# Patient Record
Sex: Male | Born: 1960 | Race: Black or African American | Hispanic: No | Marital: Single | Smoking: Light tobacco smoker
Health system: Southern US, Community
[De-identification: ages and names within clinical notes are randomized; demographics above are authoritative.]

## PROBLEM LIST (undated history)

## (undated) HISTORY — PX: KNEE SURGERY: SHX244

---

## 2004-10-03 ENCOUNTER — Emergency Department: Payer: Self-pay | Admitting: Emergency Medicine

## 2014-02-27 ENCOUNTER — Emergency Department: Payer: Self-pay | Admitting: Emergency Medicine

## 2015-02-20 ENCOUNTER — Emergency Department
Admission: EM | Admit: 2015-02-20 | Discharge: 2015-02-20 | Disposition: A | Discharge status: Home / Self Care | Payer: 59 | Attending: Emergency Medicine | Admitting: Emergency Medicine

## 2015-02-20 ENCOUNTER — Emergency Department: Payer: 59

## 2015-02-20 ENCOUNTER — Encounter: Payer: Self-pay | Admitting: Emergency Medicine

## 2015-02-20 DIAGNOSIS — M167 Other unilateral secondary osteoarthritis of hip: Secondary | ICD-10-CM | POA: Diagnosis not present

## 2015-02-20 DIAGNOSIS — F17200 Nicotine dependence, unspecified, uncomplicated: Secondary | ICD-10-CM | POA: Insufficient documentation

## 2015-02-20 DIAGNOSIS — M25552 Pain in left hip: Secondary | ICD-10-CM | POA: Diagnosis present

## 2015-02-20 MED ORDER — NAPROXEN 500 MG PO TBEC: 500.0000 mg | DELAYED_RELEASE_TABLET | Freq: Two times a day (BID) | ORAL | Status: DC

## 2015-02-20 MED ORDER — PREDNISONE 10 MG PO TABS: ORAL_TABLET | ORAL | Status: DC

## 2015-02-20 NOTE — Discharge Instructions (Signed)

## 2015-02-20 NOTE — ED Notes (Signed)
Developed left hip pain w/o injury about 1-2 week ago  States pain  Is only in hip  Non radiating

## 2015-02-20 NOTE — ED Provider Notes (Signed)
[facility] Emergency Department Provider Note  ____________________________________________  Time seen: Approximately 10:41 AM  I have reviewed the triage vital signs and the nursing notes.   HISTORY  Chief Complaint Hip Pain   HPI Dean Mueller is a 54 y.o. male who presents for evaluation of nonspecific left hip pain 1-2 weeks ago. Patient states the hematoma he realizes that he walked work 1 day. States that initially getting up or initially walking and pain in his left hip is severe has a hard time putting weight on it but as he gets: The pain seems a little bit better.   History reviewed. No pertinent past medical history.  There are no active problems to display for this patient.   History reviewed. No pertinent past surgical history.  Current Outpatient Rx  Name  Route  Sig  Dispense  Refill  . naproxen (EC NAPROSYN) 500 MG EC tablet   Oral   Take 1 tablet (500 mg total) by mouth 2 (two) times daily with a meal.   60 tablet   0   . predniSONE (DELTASONE) 10 MG tablet      Take 5 pills daily x 5 days   25 tablet   0     Allergies Review of patient's allergies indicates no known allergies.  No family history on file.  Social History Social History  Substance Use Topics  . Smoking status: Light Tobacco Smoker  . Smokeless tobacco: None  . Alcohol Use: Yes    Review of Systems Constitutional: No fever/chills Eyes: No visual changes. ENT: No sore throat. Cardiovascular: Denies chest pain. Respiratory: Denies shortness of breath. Gastrointestinal: No abdominal pain.  No nausea, no vomiting.  No diarrhea.  No constipation. Genitourinary: Negative for dysuria. Musculoskeletal: Positive for left hip pain. Skin: Negative for rash. Neurological: Negative for headaches, focal weakness or numbness.  10-point ROS otherwise negative.  ____________________________________________   PHYSICAL EXAM:  VITAL SIGNS: ED Triage  Vitals  Enc Vitals Group     BP 02/20/15 1027 115/90 mmHg     Pulse Rate 02/20/15 1027 86     Resp 02/20/15 1027 20     Temp 02/20/15 1027 98.1 F (36.7 C)     Temp src --      SpO2 02/20/15 1027 96 %     Weight 02/20/15 1027 250 lb (113.399 kg)     Height 02/20/15 1027 6\' 2"  (1.88 m)     Head Cir --      Peak Flow --      Pain Score 02/20/15 1028 9     Pain Loc --      Pain Edu? --      Excl. in GC? --     Constitutional: Alert and oriented. Well appearing and in no acute distress. Patient was getting up and down out of the wheelchair to turn the TV on and change channels. Ambulates the couch but methodically Cardiovascular: Normal rate, regular rhythm. Grossly normal heart sounds.  Good peripheral circulation. Respiratory: Normal respiratory effort.  No retractions. Lungs CTAB. Gastrointestinal: Soft and nontender. No distention. No abdominal bruits. No CVA tenderness. Musculoskeletal: No lower extremity tenderness nor edema.  No joint effusions. Neurologic:  Normal speech and language. No gross focal neurologic deficits are appreciated.  Skin:  Skin is warm, dry and intact. No rash noted. Psychiatric: Mood and affect are normal. Speech and behavior are normal.  ____________________________________________   LABS (all labs ordered are listed, but only abnormal results  are displayed)  Labs Reviewed - No data to display ____________________________________________   RADIOLOGY  Some minimal osteophytes noted. Otherwise degenerative joint disease of the left hip. ____________________________________________   PROCEDURES  Procedure(s) performed: None  Critical Care performed: No  ____________________________________________   INITIAL IMPRESSION / ASSESSMENT AND PLAN / ED COURSE  Pertinent labs & imaging results that were available during my care of the patient were reviewed by me and considered in my medical decision making (see chart for details).  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 Rx given for prednisone 50 mg daily 5 days, and start on naproxen 500 mg twice a day. Follow-up PCP or return to the ER with any worsening symptomology. Patient voices no other emergency medical complaints at this time. ____________________________________________   FINAL CLINICAL IMPRESSION(S) / ED DIAGNOSES  Final diagnoses:  Other secondary osteoarthritis of hip      Evangeline Dakin, PA-C 02/20/15 1126  Governor Rooks, MD 02/20/15 [phone]

## 2015-09-29 ENCOUNTER — Encounter: Payer: Self-pay | Admitting: *Deleted

## 2015-09-29 ENCOUNTER — Emergency Department
Admission: EM | Admit: 2015-09-29 | Discharge: 2015-09-29 | Disposition: A | Discharge status: Home / Self Care | Payer: Self-pay | Attending: Emergency Medicine | Admitting: Emergency Medicine

## 2015-09-29 DIAGNOSIS — F17210 Nicotine dependence, cigarettes, uncomplicated: Secondary | ICD-10-CM | POA: Insufficient documentation

## 2015-09-29 DIAGNOSIS — M779 Enthesopathy, unspecified: Secondary | ICD-10-CM | POA: Insufficient documentation

## 2015-09-29 DIAGNOSIS — M76891 Other specified enthesopathies of right lower limb, excluding foot: Secondary | ICD-10-CM

## 2015-09-29 MED ORDER — PREDNISONE 10 MG (21) PO TBPK: ORAL_TABLET | ORAL | Status: DC

## 2015-09-29 MED ORDER — NAPROXEN 500 MG PO TABS: 500.0000 mg | ORAL_TABLET | Freq: Two times a day (BID) | ORAL | Status: DC

## 2015-09-29 NOTE — ED Notes (Signed)
Pt reports right hip pain with no injury

## 2015-09-29 NOTE — ED Provider Notes (Signed)
[facility] Emergency Department Provider Note ____________________________________________  Time seen: Approximately 1:04 PM  I have reviewed the triage vital signs and the nursing notes.   HISTORY  Chief Complaint Hip Pain    HPI Dean Mueller is a 55 y.o. male who presents to the emergency department for evaluation of right hip pain. He denies recent injury.he states his symptoms come and go and are worse after doing lawn care. This time, pain was present after mowing 3 lawns. He states he has been evaluated here for the same and given some medications that helped. [facility] and [facility] gave him medicine that didn't help. He denies injury. He has not taken any medications recently that have helped.   History reviewed. No pertinent past medical history.  There are no active problems to display for this patient.   No past surgical history on file.  Current Outpatient Rx  Name  Route  Sig  Dispense  Refill  . naproxen (NAPROSYN) 500 MG tablet   Oral   Take 1 tablet (500 mg total) by mouth 2 (two) times daily with a meal.   30 tablet   0   . predniSONE (STERAPRED UNI-PAK 21 TAB) 10 MG (21) TBPK tablet      Take 6 tablets on day 1 Take 5 tablets on day 2 Take 4 tablets on day 3 Take 3 tablets on day 4 Take 2 tablets on day 5 Take 1 tablet on day 6   21 tablet   0     Allergies Review of patient's allergies indicates no known allergies.  No family history on file.  Social History Social History  Substance Use Topics  . Smoking status: Light Tobacco Smoker    Types: Cigarettes  . Smokeless tobacco: None  . Alcohol Use: Yes    Review of Systems Constitutional: No recent illness. Cardiovascular: Denies chest pain or palpitations. Respiratory: Denies shortness of breath. Musculoskeletal: Pain in right hip with radiation into the right inner thigh. Skin: Negative for rash, wound, lesion. Neurological: Negative for focal weakness or  numbness.  ____________________________________________   PHYSICAL EXAM:  VITAL SIGNS: ED Triage Vitals  Enc Vitals Group     BP 09/29/15 1148 132/78 mmHg     Pulse Rate 09/29/15 1148 81     Resp 09/29/15 1148 18     Temp 09/29/15 1148 97.9 F (36.6 C)     Temp Source 09/29/15 1148 Oral     SpO2 09/29/15 1148 95 %     Weight 09/29/15 1148 247 lb (112.038 kg)     Height 09/29/15 1148 6\' 2"  (1.88 m)     Head Cir --      Peak Flow --      Pain Score 09/29/15 1145 9     Pain Loc --      Pain Edu? --      Excl. in GC? --     Constitutional: Alert and oriented. Well appearing and in no acute distress. Eyes: Conjunctivae are normal. EOMI. Head: Atraumatic. Neck: No stridor.  Respiratory: Normal respiratory effort.   Musculoskeletal: Pain in the right hip with flexion. Ambulation is observed with steady gait. Neurologic:  Normal speech and language. No gross focal neurologic deficits are appreciated. Speech is normal. No gait instability. Skin:  Skin is warm, dry and intact. Atraumatic. Psychiatric: Mood and affect are normal. Speech and behavior are normal.  ____________________________________________   LABS (all labs ordered are listed, but only abnormal  results are displayed)  Labs Reviewed - No data to display ____________________________________________  RADIOLOGY  Not indicated. ____________________________________________   PROCEDURES  Procedure(s) performed: None   ____________________________________________   INITIAL IMPRESSION / ASSESSMENT AND PLAN / ED COURSE  Pertinent labs & imaging results that were available during my care of the patient were reviewed by me and considered in my medical decision making (see chart for details).  Patient to be given prednisone and naproxen as prescribed at his last visit here that he reports helped his symptoms tremendously. He was instructed to follow up with orthopedics for symptoms that are not improving with  medications or if the symptoms return. He is to return to the ER for symptoms that change or worsen if unable to schedule an appointment. ____________________________________________   FINAL CLINICAL IMPRESSION(S) / ED DIAGNOSES  Final diagnoses:  Hip flexor tendonitis, right       Chinita PesterCari B Amarien Carne, FNP 09/30/15 [phone]  Sharman CheekPhillip Stafford, MD 10/02/15 1435

## 2016-04-22 ENCOUNTER — Emergency Department
Admission: EM | Admit: 2016-04-22 | Discharge: 2016-04-22 | Disposition: A | Discharge status: Home / Self Care | Payer: Self-pay | Attending: Emergency Medicine | Admitting: Emergency Medicine

## 2016-04-22 ENCOUNTER — Encounter: Payer: Self-pay | Admitting: Emergency Medicine

## 2016-04-22 DIAGNOSIS — B9789 Other viral agents as the cause of diseases classified elsewhere: Secondary | ICD-10-CM

## 2016-04-22 DIAGNOSIS — F17210 Nicotine dependence, cigarettes, uncomplicated: Secondary | ICD-10-CM | POA: Insufficient documentation

## 2016-04-22 DIAGNOSIS — J069 Acute upper respiratory infection, unspecified: Secondary | ICD-10-CM | POA: Insufficient documentation

## 2016-04-22 LAB — INFLUENZA PANEL BY PCR (TYPE A & B)
Influenza A By PCR: NEGATIVE
Influenza B By PCR: NEGATIVE

## 2016-04-22 MED ORDER — GUAIFENESIN-CODEINE 100-10 MG/5ML PO SOLN: 5.0000 mL | ORAL | 0 refills | Status: DC | PRN

## 2016-04-22 NOTE — ED Provider Notes (Signed)
[facility] Emergency Department Provider Note   ____________________________________________   First MD Initiated Contact with Patient 04/22/16 [phone]     (approximate)  I have reviewed the triage vital signs and the nursing notes.   HISTORY  Chief Complaint Nasal Congestion and Cough   HPI Dean Mueller is a 56 y.o. male is here with sudden onset 2 days ago of body aches, cough, fever and congestion. Patient has taken over-the-counter medication with minimal relief. He states that when he got to work on Friday morning he had temperature 100.1. He also continues to cough which has not been relieved by either over-the-counter preparations such as DayQuil or NyQuil. He also states that he has had diarrhea 4 times in last 24 hours. He denies any nausea or vomiting at this time. The complains of a headache. Patient did not get flu shot. At this time he rates his pain as a 7 out of 10.   History reviewed. No pertinent past medical history.  There are no active problems to display for this patient.   History reviewed. No pertinent surgical history.  Prior to Admission medications   Medication Sig Start Date End Date Taking? Authorizing Provider  guaiFENesin-codeine 100-10 MG/5ML syrup Take 5 mLs by mouth every 4 (four) hours as needed. 04/22/16   Tommi Rumps, PA-C    Allergies Patient has no known allergies.  History reviewed. No pertinent family history.  Social History Social History  Substance Use Topics  . Smoking status: Light Tobacco Smoker    Types: Cigarettes  . Smokeless tobacco: Not on file  . Alcohol use Yes    Review of Systems Constitutional: Positive fever/chills Eyes: No visual changes. ENT: Positive sore throat. Cardiovascular: Denies chest pain. Respiratory: Denies shortness of breath. Positive cough. Gastrointestinal: No abdominal pain.  No nausea, no vomiting.  Positive diarrhea.  Musculoskeletal: Positive generalized  body aches. Skin: Negative for rash. Neurological: Negative for headaches, focal weakness or numbness.  10-point ROS otherwise negative.  ____________________________________________   PHYSICAL EXAM:  VITAL SIGNS: ED Triage Vitals  Enc Vitals Group     BP 04/22/16 0916 121/73     Pulse Rate 04/22/16 0916 79     Resp 04/22/16 0916 18     Temp 04/22/16 0916 97.7 F (36.5 C)     Temp Source 04/22/16 0916 Oral     SpO2 04/22/16 0916 100 %     Weight 04/22/16 0848 230 lb (104.3 kg)     Height --      Head Circumference --      Peak Flow --      Pain Score 04/22/16 0848 7     Pain Loc --      Pain Edu? --      Excl. in GC? --     Constitutional: Alert and oriented. Well appearing and in no acute distress. Eyes: Conjunctivae are normal. PERRL. EOMI. Head: Atraumatic. Nose: Moderate congestion/rhinnorhea.   EACs and TMs clear bilaterally. Mouth/Throat: Mucous membranes are moist.  Oropharynx non-erythematous. Neck: No stridor.   Hematological/Lymphatic/Immunilogical: No cervical lymphadenopathy. Cardiovascular: Normal rate, regular rhythm. Grossly normal heart sounds.  Good peripheral circulation. Respiratory: Normal respiratory effort.  No retractions. Lungs CTAB. Gastrointestinal: Soft and nontender. No distention.   Musculoskeletal: There is upper and lower extremities without difficulty. Normal gait was noted. Neurologic:  Normal speech and language. No gross focal neurologic deficits are appreciated.  Skin:  Skin is warm, dry and intact. No rash noted. Psychiatric:  Mood and affect are normal. Speech and behavior are normal.  ____________________________________________   LABS (all labs ordered are listed, but only abnormal results are displayed)  Labs Reviewed  INFLUENZA PANEL BY PCR (TYPE A & B)   _  PROCEDURES  Procedure(s) performed: None  Procedures  Critical Care performed: No  ____________________________________________   INITIAL IMPRESSION /  ASSESSMENT AND PLAN / ED COURSE  Pertinent labs & imaging results that were available during my care of the patient were reviewed by me and considered in my medical decision making (see chart for details).  Patient was made aware that his influenza test was negative. We did discuss that he most likely does have a viral illness but is just not testing positive for influenza. Patient is continue with fluids, Tylenol or ibuprofen as needed for fever. He was given a prescription for Robitussin AC as needed for cough. [facility] for any [facility] problems.      ____________________________________________   FINAL CLINICAL IMPRESSION(S) / ED DIAGNOSES  Final diagnoses:  Viral URI with cough      NEW MEDICATIONS STARTED DURING THIS VISIT:  Discharge Medication List as of 04/22/2016 10:21 AM    START taking these medications   Details  guaiFENesin-codeine 100-10 MG/5ML syrup Take 5 mLs by mouth every 4 (four) hours as needed., Starting Sun 04/22/2016, Print         Note:  This document was prepared using Dragon voice recognition software and may include unintentional dictation errors.    Tommi RumpsRhonda L Chrisotpher Rivero, PA-C 04/22/16 1712    Myrna Blazeravid Matthew Schaevitz, MD 04/22/16 [phone]

## 2016-04-22 NOTE — ED Triage Notes (Signed)
Pt started Friday with body aches, cough, fever (101 Friday only), congestion. Pt state s"i have the crud". No distress.

## 2016-05-18 ENCOUNTER — Emergency Department
Admission: EM | Admit: 2016-05-18 | Discharge: 2016-05-18 | Disposition: A | Discharge status: Home / Self Care | Payer: Self-pay | Attending: Emergency Medicine | Admitting: Emergency Medicine

## 2016-05-18 ENCOUNTER — Encounter: Payer: Self-pay | Admitting: Emergency Medicine

## 2016-05-18 ENCOUNTER — Emergency Department: Payer: Self-pay

## 2016-05-18 DIAGNOSIS — W108XXA Fall (on) (from) other stairs and steps, initial encounter: Secondary | ICD-10-CM | POA: Insufficient documentation

## 2016-05-18 DIAGNOSIS — M1712 Unilateral primary osteoarthritis, left knee: Secondary | ICD-10-CM | POA: Insufficient documentation

## 2016-05-18 DIAGNOSIS — F17210 Nicotine dependence, cigarettes, uncomplicated: Secondary | ICD-10-CM | POA: Insufficient documentation

## 2016-05-18 DIAGNOSIS — S8002XA Contusion of left knee, initial encounter: Secondary | ICD-10-CM | POA: Insufficient documentation

## 2016-05-18 DIAGNOSIS — W19XXXA Unspecified fall, initial encounter: Secondary | ICD-10-CM

## 2016-05-18 DIAGNOSIS — Y999 Unspecified external cause status: Secondary | ICD-10-CM | POA: Insufficient documentation

## 2016-05-18 DIAGNOSIS — Y92009 Unspecified place in unspecified non-institutional (private) residence as the place of occurrence of the external cause: Secondary | ICD-10-CM | POA: Insufficient documentation

## 2016-05-18 DIAGNOSIS — Y939 Activity, unspecified: Secondary | ICD-10-CM | POA: Insufficient documentation

## 2016-05-18 MED ORDER — KETOROLAC TROMETHAMINE 30 MG/ML IJ SOLN
30.0000 mg | Freq: Once | INTRAMUSCULAR | Status: AC
  Administered 2016-05-18: 30 mg via INTRAMUSCULAR
  Filled 2016-05-18: qty 1

## 2016-05-18 MED ORDER — MELOXICAM 15 MG PO TABS: 15.0000 mg | ORAL_TABLET | Freq: Every day | ORAL | 0 refills | Status: DC

## 2016-05-18 NOTE — ED Provider Notes (Signed)
[facility] Emergency Department Provider Note  ____________________________________________  Time seen: Approximately 11:33 AM  I have reviewed the triage vital signs and the nursing notes.   HISTORY  Chief Complaint Knee Pain    HPI Dean Mueller is a 56 y.o. male, NAD, presents to the emergency department for evaluation of left knee pain. States he was walking down his stairs at home this morning when his left knee "gave out" causing him to fall down the stairs. Denies head injury, LOC, lightheadedness, dizziness, neck pain, numbness, weakness or tingling. Has had no chest pain, shortness of breath, headache or visual changes to cause the fall. Denies back, hip or groin pain. Has not been able to bear weight on the left knee since the incident. Denies open wounds, bleeding, swelling or redness. States he has arthritis in the right knee which was "cleaned out" some years ago. Has not taken anything for his pain.    History reviewed. No pertinent past medical history.  There are no active problems to display for this patient.   History reviewed. No pertinent surgical history.  Prior to Admission medications   Medication Sig Start Date End Date Taking? Authorizing Provider  guaiFENesin-codeine 100-10 MG/5ML syrup Take 5 mLs by mouth every 4 (four) hours as needed. 04/22/16   Tommi Rumpshonda L Summers, PA-C  meloxicam (MOBIC) 15 MG tablet Take 1 tablet (15 mg total) by mouth daily. 05/18/16   Jami L Hagler, PA-C    Allergies Patient has no known allergies.  No family history on file.  Social History Social History  Substance Use Topics  . Smoking status: Light Tobacco Smoker    Types: Cigarettes  . Smokeless tobacco: Never Used  . Alcohol use Yes     Review of Systems Constitutional: No fatigue Eyes: No visual changes. Cardiovascular: No chest pain. Respiratory: No shortness of breath.  Musculoskeletal: Positive left knee pain. Negative for back, neck,  hip, groin, ankle or foot pain.  Skin: Negative for rash, redness, swelling, bruising. Neurological: Negative for headaches, focal weakness or numbness. No tingling. No LOC, lightheadedness, dizziness. 10-point ROS otherwise negative.  ____________________________________________   PHYSICAL EXAM:  VITAL SIGNS: ED Triage Vitals  Enc Vitals Group     BP 05/18/16 1128 115/70     Pulse Rate 05/18/16 1128 89     Resp 05/18/16 1128 18     Temp 05/18/16 1128 98.7 F (37.1 C)     Temp Source 05/18/16 1128 Oral     SpO2 05/18/16 1128 97 %     Weight 05/18/16 1129 220 lb (99.8 kg)     Height 05/18/16 1129 6\' 2"  (1.88 m)     Head Circumference --      Peak Flow --      Pain Score 05/18/16 1127 10     Pain Loc --      Pain Edu? --      Excl. in GC? --      Constitutional: Alert and oriented. Well appearing and in no acute distress. Eyes: Conjunctivae are normal. Head: Atraumatic. Neck: Supple with FROM. Hematological/Lymphatic/Immunilogical: No cervical lymphadenopathy. Cardiovascular: Good peripheral circulation with 2+ pulses in the left lower extremity. Respiratory: Normal respiratory effort without tachypnea or retractions.  Musculoskeletal: Tenderness to palpation of the left medial knee. Crepitus is noted with movement of the patella. No laxity with anterior or posterior drawer. No laxity with varus or valgus stress. Negative McMurrays. FROM of the left knee without difficulty but painful. No lower  extremity tenderness nor edema.  No joint effusions. Neurologic:  Normal speech and language. No gross focal neurologic deficits are appreciated. Sensation to light touch grossly intact about the left lower extremity.  Skin:  Skin is warm, dry and intact. No rash, redness, swelling, bruising, open wounds noted. Psychiatric: Mood and affect are normal. Speech and behavior are normal. Patient exhibits appropriate insight and judgement.   ____________________________________________    LABS  None ____________________________________________  EKG  None ____________________________________________  RADIOLOGY I, Hope Pigeon, personally viewed and evaluated these images (plain radiographs) as part of my medical decision making, as well as reviewing the written report by the radiologist.  Dg Knee Complete 4 Views Left  Result Date: 05/18/2016 CLINICAL DATA:  Fall this morning with a left knee injury and pain. Initial encounter. EXAM: LEFT KNEE - COMPLETE 4+ VIEW COMPARISON:  None. FINDINGS: No acute bony or joint abnormality is identified. Advanced tricompartmental osteoarthritis is seen. Multiple loose bodies are in the posterior aspect of the joint measuring up to 2.5 cm in diameter. IMPRESSION: No acute abnormality. Advanced tricompartmental osteoarthritis. Electronically Signed   By: Drusilla Kanner M.D.   On: 05/18/2016 12:01    ____________________________________________    PROCEDURES  Procedure(s) performed: None   Procedures   Medications  ketorolac (TORADOL) 30 MG/ML injection 30 mg (30 mg Intramuscular Given 05/18/16 1245)   ____________________________________________   INITIAL IMPRESSION / ASSESSMENT AND PLAN / ED COURSE  Pertinent labs & imaging results that were available during my care of the patient were reviewed by me and considered in my medical decision making (see chart for details).  Clinical Course as of May 18 1249  Fri May 18, 2016  1158 DG Knee Complete 4 Views Left [RS]  1200 DG Knee Complete 4 Views Left [JH]    Clinical Course User Index [JH] Hope Pigeon, PA-C [RS] Valda Lamb, Student-PA    Patient's diagnosis is consistent with Contusion of left knee due to fall at home and a osteoarthritis of left knee. Patient was given IM Toradol in the emergency department and tolerated well without any immediate adverse events. Patient will be discharged home with prescriptions for meloxicam to take as directed. Left knee  was placed in an Ace wrap and patient was given crutches for supportive care. Patient is to follow up with Dr. Martha Clan in orthopedics in 1 week if symptoms persist past this treatment course. Patient is given ED precautions to return to the ED for any worsening or new symptoms.   ____________________________________________  FINAL CLINICAL IMPRESSION(S) / ED DIAGNOSES  Final diagnoses:  Contusion of left knee, initial encounter  Osteoarthritis of left knee, unspecified osteoarthritis type  Fall at home, initial encounter      NEW MEDICATIONS STARTED DURING THIS VISIT:  New Prescriptions   MELOXICAM (MOBIC) 15 MG TABLET    Take 1 tablet (15 mg total) by mouth daily.         Hope Pigeon, PA-C 05/18/16 1523    Merrily Brittle, MD 05/18/16 [phone]

## 2016-05-18 NOTE — ED Triage Notes (Signed)
Patient presents to the ED with left knee pain after tripping and falling down the stairs this morning.  Patient denies hitting his head or passing out.  Patient is in no obvious distress at this time.

## 2016-08-07 IMAGING — CR DG HIP (WITH OR WITHOUT PELVIS) 2-3V*L*
3 series · 3 of 3 positions shown · non-contrast
Comparison: None.

CLINICAL DATA: Left hip pain for 2 weeks.  No known injury.

EXAM:
DG HIP (WITH OR WITHOUT PELVIS) 2-3V LEFT

[pelvis ap]
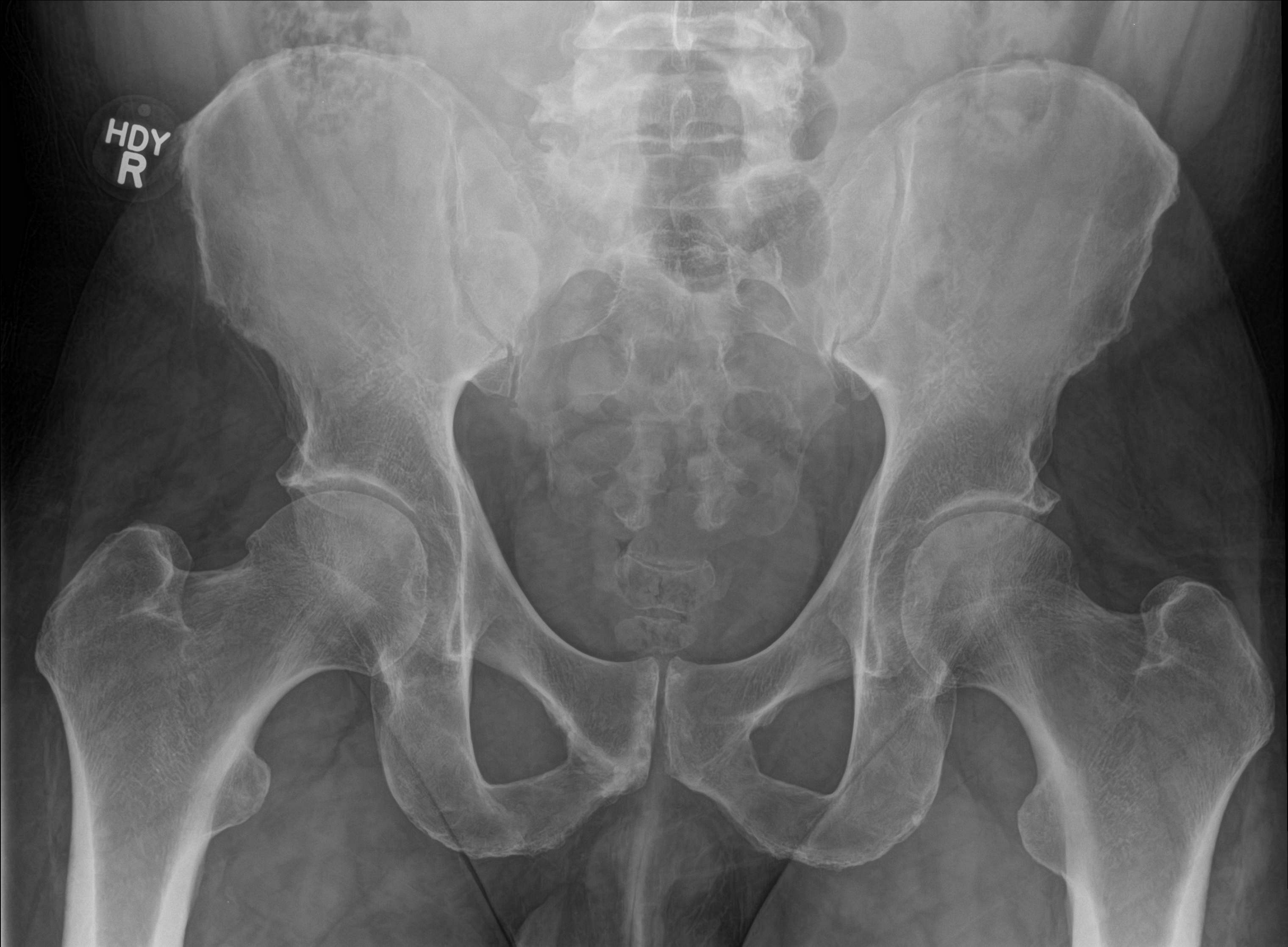

[hip ap]
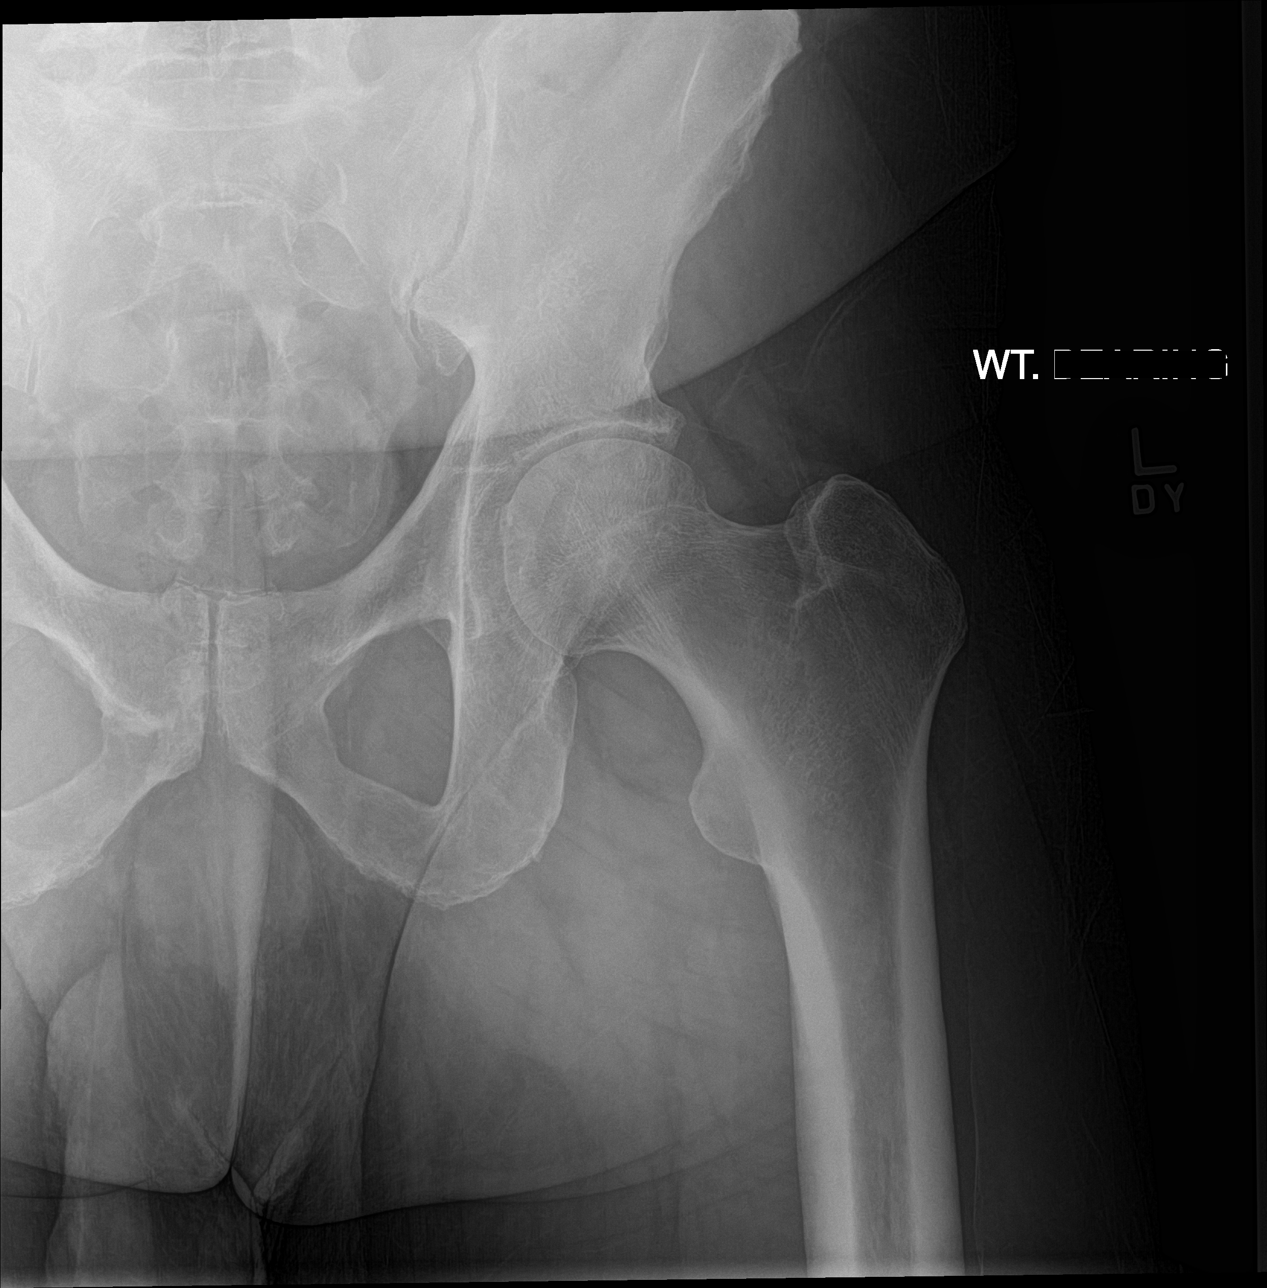

[hip lat]
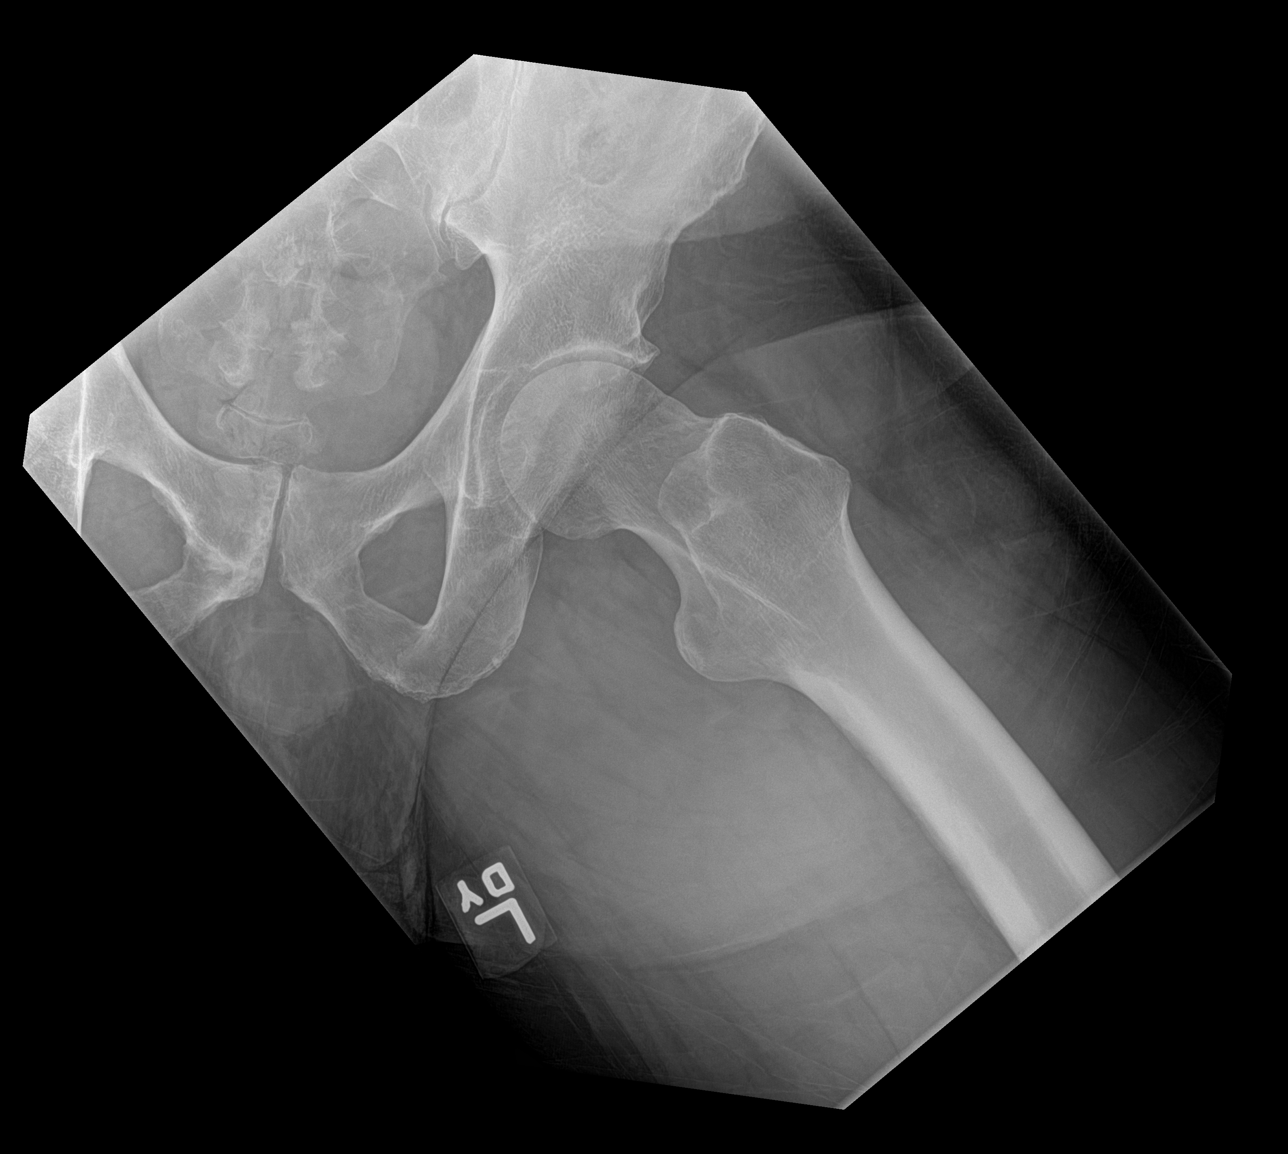

[3 of 3 positions shown; findings below may reference images not displayed]

FINDINGS: There is slight cystic degenerative changes of the acetabulum.
Femoral head appears normal. No joint effusion. Pelvic bones are
normal except for an anterior osteophyte on the right sacroiliac
joint. Slight marginal osteophyte on the right femoral head. Minimal
degenerative changes of the symphysis pubis.
IMPRESSION: Minimal degenerative changes of the left hip.

## 2016-12-02 ENCOUNTER — Emergency Department
Admission: EM | Admit: 2016-12-02 | Discharge: 2016-12-02 | Disposition: A | Discharge status: Home / Self Care | Payer: Self-pay | Attending: Emergency Medicine | Admitting: Emergency Medicine

## 2016-12-02 ENCOUNTER — Encounter: Payer: Self-pay | Admitting: Emergency Medicine

## 2016-12-02 DIAGNOSIS — R112 Nausea with vomiting, unspecified: Secondary | ICD-10-CM | POA: Insufficient documentation

## 2016-12-02 DIAGNOSIS — R197 Diarrhea, unspecified: Secondary | ICD-10-CM | POA: Insufficient documentation

## 2016-12-02 DIAGNOSIS — F17210 Nicotine dependence, cigarettes, uncomplicated: Secondary | ICD-10-CM | POA: Insufficient documentation

## 2016-12-02 LAB — COMPREHENSIVE METABOLIC PANEL
ALBUMIN: 3.2 g/dL — AB (ref 3.5–5.0)
ALK PHOS: 53 U/L (ref 38–126)
ALT: 15 U/L — AB (ref 17–63)
AST: 30 U/L (ref 15–41)
Anion gap: 3 — ABNORMAL LOW (ref 5–15)
BILIRUBIN TOTAL: 0.3 mg/dL (ref 0.3–1.2)
BUN: 19 mg/dL (ref 6–20)
CALCIUM: 8.4 mg/dL — AB (ref 8.9–10.3)
CO2: 25 mmol/L (ref 22–32)
CREATININE: 0.99 mg/dL (ref 0.61–1.24)
Chloride: 110 mmol/L (ref 101–111)
GFR calc Af Amer: >60 mL/min (ref >60)
GFR calc non Af Amer: >60 mL/min (ref >60)
GLUCOSE: 111 mg/dL — AB (ref 65–99)
Potassium: 3.7 mmol/L (ref 3.5–5.1)
SODIUM: 138 mmol/L (ref 135–145)
TOTAL PROTEIN: 5.5 g/dL — AB (ref 6.5–8.1)

## 2016-12-02 LAB — CBC
HEMATOCRIT: 45.2 % (ref 40.0–52.0)
HEMOGLOBIN: 15.3 g/dL (ref 13.0–18.0)
MCH: 30.9 pg (ref 26.0–34.0)
MCHC: 33.9 g/dL (ref 32.0–36.0)
MCV: 91.4 fL (ref 80.0–100.0)
Platelets: 115 10*3/uL — ABNORMAL LOW (ref 150–440)
RBC: 4.94 MIL/uL (ref 4.40–5.90)
RDW: 13.2 % (ref 11.5–14.5)
WBC: 4.6 10*3/uL (ref 3.8–10.6)

## 2016-12-02 LAB — LIPASE, BLOOD: Lipase: 18 U/L (ref 11–51)

## 2016-12-02 MED ORDER — ONDANSETRON 4 MG PO TBDP: 4.0000 mg | ORAL_TABLET | Freq: Three times a day (TID) | ORAL | 0 refills | Status: DC | PRN

## 2016-12-02 MED ORDER — ONDANSETRON HCL 4 MG/2ML IJ SOLN
4.0000 mg | Freq: Once | INTRAMUSCULAR | Status: AC
  Administered 2016-12-02: 4 mg via INTRAVENOUS
  Filled 2016-12-02: qty 2

## 2016-12-02 MED ORDER — SODIUM CHLORIDE 0.9 % IV SOLN
1000.0000 mL | Freq: Once | INTRAVENOUS | Status: AC
  Administered 2016-12-02: 1000 mL via INTRAVENOUS

## 2016-12-02 NOTE — ED Notes (Signed)
FIRST NURSE NOTE: C/o abdominal pain, diarrhea and vomiting since eating chinese food last night. Pt states he woke up around 2am feeling sick. 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

## 2016-12-02 NOTE — ED Provider Notes (Signed)
[facility] Emergency Department Provider Note   ____________________________________________    I have reviewed the triage vital signs and the nursing notes.   HISTORY  Chief Complaint Emesis     HPI Dean Mueller is a 56 y.o. male who presents with nausea and vomiting and one episode of diarrhea. Patient reports symptoms started at 2 AM. He attributes this to eating [place] food the night before. Currently he reports he is feeling much better. No abdominal pain at that time, he reports cramping in his upper abdomen prior to vomiting. No fevers or chills. No sick contacts. He has not taken anything for this.   History reviewed. No pertinent past medical history.  There are no active problems to display for this patient.   History reviewed. No pertinent surgical history.  Prior to Admission medications   Medication Sig Start Date End Date Taking? Authorizing Provider  guaiFENesin-codeine 100-10 MG/5ML syrup Take 5 mLs by mouth every 4 (four) hours as needed. Patient not taking: Reported on 12/02/2016 04/22/16   Tommi Rumps, PA-C  meloxicam (MOBIC) 15 MG tablet Take 1 tablet (15 mg total) by mouth daily. Patient not taking: Reported on 12/02/2016 05/18/16   Hagler, Jami L, PA-C  ondansetron (ZOFRAN ODT) 4 MG disintegrating tablet Take 1 tablet (4 mg total) by mouth every 8 (eight) hours as needed for nausea or vomiting. 12/02/16   Jene Every, MD     Allergies Patient has no known allergies.  No family history on file.  Social History Social History  Substance Use Topics  . Smoking status: Light Tobacco Smoker    Types: Cigarettes  . Smokeless tobacco: Never Used  . Alcohol use Yes    Review of Systems  Constitutional: No fever/chills Eyes: No visual changes.  ENT: No sore throat. Cardiovascular: Denies chest pain. Respiratory: Denies shortness of breath. Gastrointestinal: As above  Genitourinary: Negative for  dysuria. Musculoskeletal: Negative for back pain. Skin: Negative for rash. Neurological: Negative for headaches or weakness   ____________________________________________   PHYSICAL EXAM:  VITAL SIGNS: ED Triage Vitals  Enc Vitals Group     BP 12/02/16 0847 121/72     Pulse Rate 12/02/16 0847 83     Resp 12/02/16 0847 15     Temp 12/02/16 0847 97.7 F (36.5 C)     Temp Source 12/02/16 0847 Oral     SpO2 12/02/16 0847 98 %     Weight 12/02/16 0847 104.3 kg (230 lb)     Height 12/02/16 0847 1.88 m (6\' 2" )     Head Circumference --      Peak Flow --      Pain Score 12/02/16 0852 4     Pain Loc --      Pain Edu? --      Excl. in GC? --     Constitutional: Alert and oriented. No acute distress. Pleasant and interactive Eyes: Conjunctivae are normal.   Nose: No congestion/rhinnorhea. Mouth/Throat: Mucous membranes are moist.    Cardiovascular: Normal rate, regular rhythm. Grossly normal heart sounds.  Good peripheral circulation. Respiratory: Normal respiratory effort.  No retractions. Lungs CTAB. Gastrointestinal: Soft and nontender. No distention.  No CVA tenderness. Genitourinary: deferred Musculoskeletal: No lower extremity tenderness nor edema.  Warm and well perfused Neurologic:  Normal speech and language. No gross focal neurologic deficits are appreciated.  Skin:  Skin is warm, dry and intact. No rash noted. Psychiatric: Mood and affect are normal. Speech and behavior are  normal.  ____________________________________________   LABS (all labs ordered are listed, but only abnormal results are displayed)  Labs Reviewed  CBC - Abnormal; Notable for the following:       Result Value   Platelets 115 (*)    All other components within normal limits  COMPREHENSIVE METABOLIC PANEL - Abnormal; Notable for the following:    Glucose, Bld 111 (*)    Calcium 8.4 (*)    Total Protein 5.5 (*)    Albumin 3.2 (*)    ALT 15 (*)    Anion gap 3 (*)    All other components  within normal limits  LIPASE, BLOOD   ____________________________________________  EKG  None ____________________________________________  RADIOLOGY  None ____________________________________________   PROCEDURES  Procedure(s) performed: No    Critical Care performed: No ____________________________________________   INITIAL IMPRESSION / ASSESSMENT AND PLAN / ED COURSE  Pertinent labs & imaging results that were available during my care of the patient were reviewed by me and considered in my medical decision making (see chart for details).  Patient well-appearing and in no acute distress. Symptoms have improved without treatment. We'll give IV fluids and IV Zofran, check labs and if normal anticipate discharge with outpatient management for likely viral gastritis versus food related illness  ----------------------------------------- 10:33 AM on 12/02/2016 -----------------------------------------  Patient is feeling well, no vomiting, tolerating by mouth's. Okay for discharge at this point, patient agrees. Return precautions discussed    ____________________________________________   FINAL CLINICAL IMPRESSION(S) / ED DIAGNOSES  Final diagnoses:  Nausea vomiting and diarrhea      NEW MEDICATIONS STARTED DURING THIS VISIT:  New Prescriptions   ONDANSETRON (ZOFRAN ODT) 4 MG DISINTEGRATING TABLET    Take 1 tablet (4 mg total) by mouth every 8 (eight) hours as needed for nausea or vomiting.     Note:  This document was prepared using Dragon voice recognition software and may include unintentional dictation errors.    Jene EveryKinner, Radiance Deady, MD 12/02/16 1136

## 2016-12-02 NOTE — ED Triage Notes (Signed)
Pt c/o vomiting x5 and diarrhea x5 since last 2am. Pt states episodes began after eating chinese food. Pt unable to keep ginger ale down at this time. Pt A&Ox4, NAD, skin warm and dry.

## 2017-05-06 ENCOUNTER — Other Ambulatory Visit: Payer: Self-pay

## 2017-05-06 ENCOUNTER — Encounter: Payer: Self-pay | Admitting: Emergency Medicine

## 2017-05-06 ENCOUNTER — Emergency Department
Admission: EM | Admit: 2017-05-06 | Discharge: 2017-05-06 | Disposition: A | Discharge status: Home / Self Care | Payer: Self-pay | Attending: Emergency Medicine | Admitting: Emergency Medicine

## 2017-05-06 DIAGNOSIS — F17210 Nicotine dependence, cigarettes, uncomplicated: Secondary | ICD-10-CM | POA: Insufficient documentation

## 2017-05-06 DIAGNOSIS — Y999 Unspecified external cause status: Secondary | ICD-10-CM | POA: Insufficient documentation

## 2017-05-06 DIAGNOSIS — Y93K9 Activity, other involving animal care: Secondary | ICD-10-CM | POA: Insufficient documentation

## 2017-05-06 DIAGNOSIS — Y92007 Garden or yard of unspecified non-institutional (private) residence as the place of occurrence of the external cause: Secondary | ICD-10-CM | POA: Insufficient documentation

## 2017-05-06 DIAGNOSIS — W1789XA Other fall from one level to another, initial encounter: Secondary | ICD-10-CM | POA: Insufficient documentation

## 2017-05-06 DIAGNOSIS — S39012A Strain of muscle, fascia and tendon of lower back, initial encounter: Secondary | ICD-10-CM | POA: Insufficient documentation

## 2017-05-06 MED ORDER — MELOXICAM 15 MG PO TABS
15.0000 mg | ORAL_TABLET | Freq: Every day | ORAL | 0 refills | Status: DC
Start: 2017-05-06 — End: 2017-11-12

## 2017-05-06 MED ORDER — METHOCARBAMOL 500 MG PO TABS: 500.0000 mg | ORAL_TABLET | Freq: Four times a day (QID) | ORAL | 0 refills | Status: DC

## 2017-05-06 NOTE — ED Provider Notes (Signed)
[facility] Emergency Department Provider Note  ____________________________________________  Time seen: Approximately 3:15 PM  I have reviewed the triage vital signs and the nursing notes.   HISTORY  Chief Complaint Back Pain    HPI Dean Mueller is a 57 y.o. male who presents the emergency department complaining of right lower back pain.  Patient reports that he went outside to get his dog is barking, and he stepped into a hole that the dog had dog.  Patient reports that it "jarred" his back.  He has been having right lower back pain since.  He denies any bowel or bladder dysfunction, saddle anesthesia, paresthesias.  No radiation into the hip.  No history of back problems.  No other complaints.  Patient is not tried any medication for this complaint prior to arrival.  History reviewed. No pertinent past medical history.  There are no active problems to display for this patient.   Past Surgical History:  Procedure Laterality Date  . KNEE SURGERY Right     Prior to Admission medications   Medication Sig Start Date End Date Taking? Authorizing Provider  guaiFENesin-codeine 100-10 MG/5ML syrup Take 5 mLs by mouth every 4 (four) hours as needed. Patient not taking: Reported on 12/02/2016 04/22/16   Tommi RumpsSummers, Rhonda L, PA-C  meloxicam (MOBIC) 15 MG tablet Take 1 tablet (15 mg total) by mouth daily. 05/06/17   Locklyn Henriquez, Delorise RoyalsJonathan D, PA-C  methocarbamol (ROBAXIN) 500 MG tablet Take 1 tablet (500 mg total) by mouth 4 (four) times daily. 05/06/17   Benicia Bergevin, Delorise RoyalsJonathan D, PA-C  ondansetron (ZOFRAN ODT) 4 MG disintegrating tablet Take 1 tablet (4 mg total) by mouth every 8 (eight) hours as needed for nausea or vomiting. 12/02/16   Jene EveryKinner, Robert, MD    Allergies Patient has no known allergies.  No family history on file.  Social History Social History   Tobacco Use  . Smoking status: Light Tobacco Smoker    Types: Cigarettes  . Smokeless tobacco: Never Used   Substance Use Topics  . Alcohol use: Yes  . Drug use: Not on file     Review of Systems  Constitutional: No fever/chills Eyes: No visual changes.  Cardiovascular: no chest pain. Respiratory: no cough. No SOB. Gastrointestinal: No abdominal pain.  No nausea, no vomiting.  No diarrhea.  No constipation. Genitourinary: Negative for dysuria. No hematuria Musculoskeletal: Positive for right lower back pain Skin: Negative for rash, abrasions, lacerations, ecchymosis. Neurological: Negative for headaches, focal weakness or numbness. 10-point ROS otherwise negative.  ____________________________________________   PHYSICAL EXAM:  VITAL SIGNS: ED Triage Vitals [05/06/17 1457]  Enc Vitals Group     BP      Pulse      Resp      Temp      Temp src      SpO2      Weight 248 lb (112.5 kg)     Height 6\' 2"  (1.88 m)     Head Circumference      Peak Flow      Pain Score      Pain Loc      Pain Edu?      Excl. in GC?      Constitutional: Alert and oriented. Well appearing and in no acute distress. Eyes: Conjunctivae are normal. PERRL. EOMI. Head: Atraumatic. Neck: No stridor.    Cardiovascular: Normal rate, regular rhythm. Normal S1 and S2.  Good peripheral circulation. Respiratory: Normal respiratory effort without tachypnea or retractions. Lungs CTAB.  Good air entry to the bases with no decreased or absent breath sounds. Musculoskeletal: Full range of motion to all extremities. No gross deformities appreciated. No Visible deformity, ecchymosis, edema noted to the spine.  Full range of motion to the lumbar spine.  Patient is nontender to palpation midline.  Tender palpation of the right-sided paraspinal muscle group with appreciable spasms.  Nontender palpation of the right-sided sciatic notch.  Negative straight leg raise bilaterally.  Dorsalis pedis pulse intact bilateral lower extremities.  Sensation intact and equal bilateral lower extremities. Neurologic:  Normal speech and  language. No gross focal neurologic deficits are appreciated.  Skin:  Skin is warm, dry and intact. No rash noted. Psychiatric: Mood and affect are normal. Speech and behavior are normal. Patient exhibits appropriate insight and judgement.   ____________________________________________   LABS (all labs ordered are listed, but only abnormal results are displayed)  Labs Reviewed - No data to display ____________________________________________  EKG   ____________________________________________  RADIOLOGY   No results found.  ____________________________________________    PROCEDURES  Procedure(s) performed:    Procedures    Medications - No data to display   ____________________________________________   INITIAL IMPRESSION / ASSESSMENT AND PLAN / ED COURSE  Pertinent labs & imaging results that were available during my care of the patient were reviewed by me and considered in my medical decision making (see chart for details).  Review of the  [place]RS was performed in accordance of the [facility] prior to dispensing any controlled drugs.     Patient's diagnosis is consistent with lumbar sacral strain.  Patient presented to emergency department with right-sided lower back pain after stepping in hole.  Differential included fracture versus strain versus dislocation.  Patient with a reassuring exam.  Patient has tenderness to palpation of the paraspinal muscle groups with palpable spasms.  No indication for labs or imaging at this time.. Patient will be discharged home with prescriptions for meloxicam and Robaxin. Patient is to follow up with primary care as needed or otherwise directed. Patient is given ED precautions to return to the ED for any worsening or new symptoms.     ____________________________________________  FINAL CLINICAL IMPRESSION(S) / ED DIAGNOSES  Final diagnoses:  Strain of lumbar region, initial encounter      NEW MEDICATIONS STARTED DURING  THIS VISIT:  ED Discharge Orders        Ordered    meloxicam (MOBIC) 15 MG tablet  Daily     05/06/17 1516    methocarbamol (ROBAXIN) 500 MG tablet  4 times daily     05/06/17 1516          This chart was dictated using voice recognition software/Dragon. Despite best efforts to proofread, errors can occur which can change the meaning. Any change was purely unintentional.    Racheal Patches, PA-C 05/06/17 1535    Sharyn Creamer, MD 05/07/17 [phone]

## 2017-05-06 NOTE — ED Notes (Signed)
See triage note.  No radiation down leg.

## 2017-05-06 NOTE — ED Triage Notes (Signed)
Says his right low back-just above the hip has been sore for about 4 days.  Says he stepped in a h ole that his dog dug and that jarred it.

## 2017-10-24 ENCOUNTER — Other Ambulatory Visit: Payer: Self-pay

## 2017-10-24 ENCOUNTER — Emergency Department
Admission: EM | Admit: 2017-10-24 | Discharge: 2017-10-24 | Disposition: A | Discharge status: Home / Self Care | Payer: Self-pay | Attending: Emergency Medicine | Admitting: Emergency Medicine

## 2017-10-24 DIAGNOSIS — R319 Hematuria, unspecified: Secondary | ICD-10-CM | POA: Insufficient documentation

## 2017-10-24 DIAGNOSIS — F17210 Nicotine dependence, cigarettes, uncomplicated: Secondary | ICD-10-CM | POA: Insufficient documentation

## 2017-10-24 LAB — BASIC METABOLIC PANEL
Anion gap: 7 (ref 5–15)
BUN: 17 mg/dL (ref 6–20)
CALCIUM: 8.5 mg/dL — AB (ref 8.9–10.3)
CO2: 21 mmol/L — AB (ref 22–32)
CREATININE: 1.04 mg/dL (ref 0.61–1.24)
Chloride: 111 mmol/L (ref 98–111)
GFR calc Af Amer: >60 mL/min (ref >60)
GFR calc non Af Amer: >60 mL/min (ref >60)
GLUCOSE: 122 mg/dL — AB (ref 70–99)
Potassium: 4 mmol/L (ref 3.5–5.1)
Sodium: 139 mmol/L (ref 135–145)

## 2017-10-24 LAB — URINALYSIS, COMPLETE (UACMP) WITH MICROSCOPIC
BILIRUBIN URINE: NEGATIVE
Bacteria, UA: NONE SEEN
GLUCOSE, UA: NEGATIVE mg/dL
HGB URINE DIPSTICK: NEGATIVE
KETONES UR: NEGATIVE mg/dL
LEUKOCYTES UA: NEGATIVE
NITRITE: NEGATIVE
Protein, ur: NEGATIVE mg/dL
Specific Gravity, Urine: 1.021 (ref 1.005–1.030)
pH: 5 (ref 5.0–8.0)

## 2017-10-24 LAB — CBC
HCT: 47.8 % (ref 40.0–52.0)
Hemoglobin: 16.1 g/dL (ref 13.0–18.0)
MCH: 30.7 pg (ref 26.0–34.0)
MCHC: 33.6 g/dL (ref 32.0–36.0)
MCV: 91.4 fL (ref 80.0–100.0)
PLATELETS: 151 10*3/uL (ref 150–440)
RBC: 5.23 MIL/uL (ref 4.40–5.90)
RDW: 12.9 % (ref 11.5–14.5)
WBC: 5.4 10*3/uL (ref 3.8–10.6)

## 2017-10-24 LAB — TYPE AND SCREEN
ABO/RH(D): A POS
Antibody Screen: NEGATIVE

## 2017-10-24 NOTE — Discharge Instructions (Signed)
Please make an appointment to follow-up with the urologist within 2 weeks for reexamination and return to the emergency department sooner for any concerns whatsoever.  She was  It was a pleasure to take care of you today, and thank you for coming to our emergency department.  If you have any questions or concerns before leaving please ask the nurse to grab me and I'm more than happy to go through your aftercare instructions again.  If you were prescribed any opioid pain medication today such as Norco, Vicodin, Percocet, morphine, hydrocodone, or oxycodone please make sure you do not drive when you are taking this medication as it can alter your ability to drive safely.  If you have any concerns once you are home that you are not improving or are in fact getting worse before you can make it to your follow-up appointment, please do not hesitate to call 911 and come back for further evaluation.  Merrily BrittleNeil Seletha Zimmermann, MD  Results for orders placed or performed during the hospital encounter of 10/24/17  Urinalysis, Complete w Microscopic  Result Value Ref Range   Color, Urine YELLOW (A) YELLOW   APPearance CLEAR (A) CLEAR   Specific Gravity, Urine 1.021 1.005 - 1.030   pH 5.0 5.0 - 8.0   Glucose, UA NEGATIVE NEGATIVE mg/dL   Hgb urine dipstick NEGATIVE NEGATIVE   Bilirubin Urine NEGATIVE NEGATIVE   Ketones, ur NEGATIVE NEGATIVE mg/dL   Protein, ur NEGATIVE NEGATIVE mg/dL   Nitrite NEGATIVE NEGATIVE   Leukocytes, UA NEGATIVE NEGATIVE   RBC / HPF 0-5 0 - 5 RBC/hpf   WBC, UA 0-5 0 - 5 WBC/hpf   Bacteria, UA NONE SEEN NONE SEEN   Squamous Epithelial / LPF 0-5 0 - 5   Mucus PRESENT   Basic metabolic panel  Result Value Ref Range   Sodium 139 135 - 145 mmol/L   Potassium 4.0 3.5 - 5.1 mmol/L   Chloride 111 98 - 111 mmol/L   CO2 21 (L) 22 - 32 mmol/L   Glucose, Bld 122 (H) 70 - 99 mg/dL   BUN 17 6 - 20 mg/dL   Creatinine, Ser 1.611.04 0.61 - 1.24 mg/dL   Calcium 8.5 (L) 8.9 - 10.3 mg/dL   GFR calc  non Af Amer >60 >60 mL/min   GFR calc Af Amer >60 >60 mL/min   Anion gap 7 5 - 15  CBC  Result Value Ref Range   WBC 5.4 3.8 - 10.6 K/uL   RBC 5.23 4.40 - 5.90 MIL/uL   Hemoglobin 16.1 13.0 - 18.0 g/dL   HCT 09.647.8 04.540.0 - 40.952.0 %   MCV 91.4 80.0 - 100.0 fL   MCH 30.7 26.0 - 34.0 pg   MCHC 33.6 32.0 - 36.0 g/dL   RDW 81.112.9 91.411.5 - 78.214.5 %   Platelets 151 150 - 440 K/uL  Type and screen [facility]  Result Value Ref Range   ABO/RH(D) A POS    Antibody Screen NEG    Sample Expiration      10/27/2017 Performed at [facility], [address] [place], [place] [id number]

## 2017-10-24 NOTE — ED Triage Notes (Signed)
Noticed blood in urine today. Denies pain. States "I feel like a champ." alert, oriented, ambulatory. States happened about 9 months ago.

## 2017-10-24 NOTE — ED Notes (Signed)
Pt to the er for hematuria. Pt says he played basketball last night and took a knee to the groin. Pt states it was painful but he was able to continue to play. Pt says today at work he felt the need to urinate and when he did he had blood in urine. Pt denies pain at that time. No pain at this time. Pt takes ASA every day. Pt states he had a similar incident 9 months ago.

## 2017-10-24 NOTE — ED Provider Notes (Signed)
[facility] Emergency Department Provider Note  ____________________________________________   First MD Initiated Contact with Patient 10/24/17 1504     (approximate)  I have reviewed the triage vital signs and the nursing notes.   HISTORY  Chief Complaint Hematuria   HPI Dean Mueller is a 57 y.o. male who self presents to the emergency department with an episode of hematuria earlier today.  It was not grossly bloody but blood-tinged urine.  He has no other complaints.  No testicular pain.  No penile pain.  No discharge.  He said that he was playing basketball yesterday and "took a need to my groin" however he was able to continue playing and he is in no particular discomfort at this point.  This did happen to him previously several months ago and resolved on its own.  He denies chest pain shortness of breath abdominal pain nausea vomiting or flank pain.   Takes a baby aspirin a day and no other blood thinning medication.   History reviewed. No pertinent past medical history.  There are no active problems to display for this patient.   Past Surgical History:  Procedure Laterality Date  . KNEE SURGERY Right     Prior to Admission medications   Medication Sig Start Date End Date Taking? Authorizing Provider  guaiFENesin-codeine 100-10 MG/5ML syrup Take 5 mLs by mouth every 4 (four) hours as needed. Patient not taking: Reported on 12/02/2016 04/22/16   Tommi RumpsSummers, Rhonda L, PA-C  meloxicam (MOBIC) 15 MG tablet Take 1 tablet (15 mg total) by mouth daily. 05/06/17   Cuthriell, Delorise RoyalsJonathan D, PA-C  methocarbamol (ROBAXIN) 500 MG tablet Take 1 tablet (500 mg total) by mouth 4 (four) times daily. 05/06/17   Cuthriell, Delorise RoyalsJonathan D, PA-C  ondansetron (ZOFRAN ODT) 4 MG disintegrating tablet Take 1 tablet (4 mg total) by mouth every 8 (eight) hours as needed for nausea or vomiting. 12/02/16   Jene EveryKinner, Robert, MD    Allergies Patient has no known allergies.  History reviewed.  No pertinent family history.  Social History Social History   Tobacco Use  . Smoking status: Light Tobacco Smoker    Types: Cigarettes  . Smokeless tobacco: Never Used  Substance Use Topics  . Alcohol use: Yes  . Drug use: Not on file    Review of Systems Constitutional: No fever/chills ENT: No sore throat. Cardiovascular: Denies chest pain. Respiratory: Denies shortness of breath. Gastrointestinal: No abdominal pain.  No nausea, no vomiting.  No diarrhea.  No constipation. Musculoskeletal: Negative for back pain. Neurological: Negative for headaches   ____________________________________________   PHYSICAL EXAM:  VITAL SIGNS: ED Triage Vitals  Enc Vitals Group     BP 10/24/17 1305 (!) 118/93     Pulse Rate 10/24/17 1305 89     Resp 10/24/17 1305 16     Temp 10/24/17 1305 98.3 F (36.8 C)     Temp Source 10/24/17 1305 Oral     SpO2 10/24/17 1305 98 %     Weight 10/24/17 1304 254 lb (115.2 kg)     Height 10/24/17 1304 6\' 2"  (1.88 m)     Head Circumference --      Peak Flow --      Pain Score 10/24/17 1304 0     Pain Loc --      Pain Edu? --      Excl. in GC? --     Constitutional: Alert and oriented x4 joking laughing well-appearing Head: Atraumatic. Nose: No congestion/rhinnorhea.  Mouth/Throat: No trismus Neck: No stridor.   Cardiovascular: Regular rate and rhythm Respiratory: Normal respiratory effort.  No retractions. GU: Normal testes normal lie.  Circumcised phallus.  No tenderness no discharge no ecchymosis Neurologic:  Normal speech and language. No gross focal neurologic deficits are appreciated.  Skin:  Skin is warm, dry and intact. No rash noted.    ____________________________________________  LABS (all labs ordered are listed, but only abnormal results are displayed)  Labs Reviewed  URINALYSIS, COMPLETE (UACMP) WITH MICROSCOPIC - Abnormal; Notable for the following components:      Result Value   Color, Urine YELLOW (*)    APPearance  CLEAR (*)    All other components within normal limits  BASIC METABOLIC PANEL - Abnormal; Notable for the following components:   CO2 21 (*)    Glucose, Bld 122 (*)    Calcium 8.5 (*)    All other components within normal limits  CBC  TYPE AND SCREEN    Lab work reviewed by me with no hematuria noted __________________________________________  EKG   ____________________________________________  RADIOLOGY   ____________________________________________   DIFFERENTIAL includes but not limited to  Rhabdomyolysis, prostate cancer, bladder cancer, kidney stone, UTI   PROCEDURES  Procedure(s) performed: no  Procedures  Critical Care performed: no  ____________________________________________   INITIAL IMPRESSION / ASSESSMENT AND PLAN / ED COURSE  Pertinent labs & imaging results that were available during my care of the patient were reviewed by me and considered in my medical decision making (see chart for details).   As part of my medical decision making, I reviewed the following data within the electronic MEDICAL RECORD NUMBER History obtained from family if available, nursing notes, old chart and ekg, as well as notes from prior ED visits.  The patient is asymptomatic and very well-appearing.  His hematuria has resolved.  It could be secondary to trauma however he had a similar episode 9 months ago that resolved raising some concern for possible bladder versus prostate mass.  I will refer him to urology as an outpatient to discuss possible cystoscopy.      ____________________________________________   FINAL CLINICAL IMPRESSION(S) / ED DIAGNOSES  Final diagnoses:  Hematuria, unspecified type      NEW MEDICATIONS STARTED DURING THIS VISIT:  Discharge Medication List as of 10/24/2017  3:14 PM       Note:  This document was prepared using Dragon voice recognition software and may include unintentional dictation errors.      Merrily Brittle, MD 10/28/17  1101

## 2017-11-12 ENCOUNTER — Other Ambulatory Visit: Payer: Self-pay

## 2017-11-12 ENCOUNTER — Encounter: Payer: Self-pay | Admitting: Emergency Medicine

## 2017-11-12 ENCOUNTER — Emergency Department
Admission: EM | Admit: 2017-11-12 | Discharge: 2017-11-12 | Disposition: A | Discharge status: Home / Self Care | Payer: Self-pay | Attending: Emergency Medicine | Admitting: Emergency Medicine

## 2017-11-12 DIAGNOSIS — F17210 Nicotine dependence, cigarettes, uncomplicated: Secondary | ICD-10-CM | POA: Insufficient documentation

## 2017-11-12 DIAGNOSIS — L02212 Cutaneous abscess of back [any part, except buttock]: Secondary | ICD-10-CM | POA: Insufficient documentation

## 2017-11-12 DIAGNOSIS — L0291 Cutaneous abscess, unspecified: Secondary | ICD-10-CM

## 2017-11-12 MED ORDER — TRAMADOL HCL 50 MG PO TABS: 50.0000 mg | ORAL_TABLET | Freq: Four times a day (QID) | ORAL | 0 refills | Status: DC | PRN

## 2017-11-12 MED ORDER — LIDOCAINE HCL (PF) 1 % IJ SOLN
5.0000 mL | Freq: Once | INTRAMUSCULAR | Status: AC
  Administered 2017-11-12: 5 mL
  Filled 2017-11-12: qty 5

## 2017-11-12 MED ORDER — CEPHALEXIN 500 MG PO CAPS: 500.0000 mg | ORAL_CAPSULE | Freq: Three times a day (TID) | ORAL | 0 refills | Status: DC

## 2017-11-12 NOTE — ED Provider Notes (Signed)
[facility] Emergency Department Provider Note  ____________________________________________   First MD Initiated Contact with Patient 11/12/17 1010     (approximate)  I have reviewed the triage vital signs and the nursing notes.   HISTORY  Chief Complaint Cyst    HPI Dean Mueller is a 57 y.o. male presents emergency department complaining of a bump on his back.  He states it has been there for about a month but noticed in the last 2 days has become larger and more painful.  He denies any fever chills or drainage from the area.  He is never had a lump like this before.    History reviewed. No pertinent past medical history.  There are no active problems to display for this patient.   Past Surgical History:  Procedure Laterality Date  . KNEE SURGERY Right     Prior to Admission medications   Medication Sig Start Date End Date Taking? Authorizing Provider  cephALEXin (KEFLEX) 500 MG capsule Take 1 capsule (500 mg total) by mouth 3 (three) times daily. 11/12/17   Aryka Coonradt, Roselyn Bering, PA-C  traMADol (ULTRAM) 50 MG tablet Take 1 tablet (50 mg total) by mouth every 6 (six) hours as needed. 11/12/17   Faythe Ghee, PA-C    Allergies Patient has no known allergies.  No family history on file.  Social History Social History   Tobacco Use  . Smoking status: Light Tobacco Smoker    Types: Cigarettes  . Smokeless tobacco: Never Used  Substance Use Topics  . Alcohol use: Yes  . Drug use: Not on file    Review of Systems  Constitutional: No fever/chills Eyes: No visual changes. ENT: No sore throat. Respiratory: Denies cough Genitourinary: Negative for dysuria. Musculoskeletal: Negative for back pain. Skin: Negative for rash.  Positive for lump on his back    ____________________________________________   PHYSICAL EXAM:  VITAL SIGNS: ED Triage Vitals [11/12/17 0959]  Enc Vitals Group     BP (!) 135/94     Pulse Rate 84     Resp 16      Temp 98.4 F (36.9 C)     Temp Source Oral     SpO2 99 %     Weight 250 lb (113.4 kg)     Height 6\' 2"  (1.88 m)     Head Circumference      Peak Flow      Pain Score 9     Pain Loc      Pain Edu?      Excl. in GC?     Constitutional: Alert and oriented. Well appearing and in no acute distress. Eyes: Conjunctivae are normal.  Head: Atraumatic. Nose: No congestion/rhinnorhea. Mouth/Throat: Mucous membranes are moist.   Neck:  supple no lymphadenopathy noted Cardiovascular: Normal rate, regular rhythm. Heart sounds are normal Respiratory: Normal respiratory effort.  No retractions, lungs c t a  Abd: soft nontender bs normal all 4 quad GU: deferred Musculoskeletal: FROM all extremities, warm and well perfused Neurologic:  Normal speech and language.  Skin:  Skin is warm, dry and intact. No rash noted.  Positive for sebaceous cyst on the upper spine.  No redness or drainage is noted Psychiatric: Mood and affect are normal. Speech and behavior are normal.  ____________________________________________   LABS (all labs ordered are listed, but only abnormal results are displayed)  Labs Reviewed - No data to display ____________________________________________   ____________________________________________  RADIOLOGY    ____________________________________________   PROCEDURES  Procedure(s) performed:   Marland Kitchen.Marland Kitchen.Incision and Drainage Date/Time: 11/12/2017 6:49 PM Performed by: Faythe GheeFisher, Jaxx Huish W, PA-C Authorized by: Faythe GheeFisher, Rodney Yera W, PA-C   Consent:    Consent obtained:  Verbal   Consent given by:  Patient   Risks discussed:  Incomplete drainage, infection and pain   Alternatives discussed:  No treatment Location:    Type:  Cyst   Location:  Trunk   Trunk location:  Back Pre-procedure details:    Skin preparation:  Betadine Anesthesia (see MAR for exact dosages):    Anesthesia method:  Local infiltration   Local anesthetic:  Lidocaine 1% w/o epi Procedure type:     Complexity:  Simple Procedure details:    Incision types:  Single straight   Incision depth:  Dermal   Scalpel blade:  11   Wound management:  Probed and deloculated and irrigated with saline   Drainage:  Bloody and purulent   Drainage amount:  Moderate   Wound treatment:  Drain placed   Packing materials:  1/4 in iodoform gauze Post-procedure details:    Patient tolerance of procedure:  Tolerated well, no immediate complications      ____________________________________________   INITIAL IMPRESSION / ASSESSMENT AND PLAN / ED COURSE  Pertinent labs & imaging results that were available during my care of the patient were reviewed by me and considered in my medical decision making (see chart for details).   Patient is a 57 year old male presents emergency department complaining of a bump on his upper back.  The bump is been there for 1 month and has increased in size the last 2 days.  Physical exam there appears to be a sebaceous cyst noted on the patient's upper back.  Explained to him that we could do an I&D of this.  He wants to wait till his wife gets to the emergency department before we proceed.  After his wife arrived at the emergency department, the patient was given a local anesthetic and an incision with a #11 blade, thick drainage was expelled typical of a sebaceous cyst.  Patient tolerated procedure well.  Iodoform was used as a Brewing technologistpacking material.  A dressing was applied by nursing staff.  Patient was given a prescription for Keflex and tramadol.  He is to return to the emergency department or see his regular doctor in 2 days for dressing change and packing removal.  If he is worsening he should return the emergency department.  He states he understands will comply with our instructions.  Was given a work note for today and tomorrow.  He was discharged in stable condition in the care of his wife.     As part of my medical decision making, I reviewed the following data  within the electronic MEDICAL RECORD NUMBER Nursing notes reviewed and incorporated, Notes from prior ED visits and [place] Controlled Substance Database  ____________________________________________   FINAL CLINICAL IMPRESSION(S) / ED DIAGNOSES  Final diagnoses:  Abscess      NEW MEDICATIONS STARTED DURING THIS VISIT:  Discharge Medication List as of 11/12/2017 11:47 AM    START taking these medications   Details  cephALEXin (KEFLEX) 500 MG capsule Take 1 capsule (500 mg total) by mouth 3 (three) times daily., Starting Tue 11/12/2017, Normal    traMADol (ULTRAM) 50 MG tablet Take 1 tablet (50 mg total) by mouth every 6 (six) hours as needed., Starting Tue 11/12/2017, Normal         Note:  This document was prepared using Dragon voice recognition  software and may include unintentional dictation errors.    Faythe GheeFisher, Phyliss Hulick W, PA-C 11/12/17 Pleas [id number]    Stafford, Phillip, MD 11/25/17 [phone]

## 2017-11-12 NOTE — Discharge Instructions (Signed)
Keep the areas dry as possible.  If the packing falls out please shower and let the soap and water run through the wound.  Keep the area covered while it is draining.  Take the medication as prescribed.  He may also take Tylenol and ibuprofen for pain as needed.  Return to the emergency department in 2 days for packing removal

## 2017-11-12 NOTE — ED Notes (Signed)
See triage note  Presents with raised area to mid back  States he noticed an area about 1 month ago  Became larger and more painful last  No fever

## 2017-11-12 NOTE — ED Triage Notes (Signed)
Patient here with complaints of "knot" mid upper back, started day before yesterday, worsening last night.  Round raised area upper mid back.

## 2017-11-12 NOTE — ED Notes (Signed)
DSD to back wound.

## 2018-02-06 ENCOUNTER — Other Ambulatory Visit: Payer: Self-pay

## 2018-02-06 ENCOUNTER — Emergency Department
Admission: EM | Admit: 2018-02-06 | Discharge: 2018-02-06 | Disposition: A | Discharge status: Home / Self Care | Payer: Self-pay | Attending: Emergency Medicine | Admitting: Emergency Medicine

## 2018-02-06 DIAGNOSIS — K5900 Constipation, unspecified: Secondary | ICD-10-CM | POA: Insufficient documentation

## 2018-02-06 DIAGNOSIS — K5909 Other constipation: Secondary | ICD-10-CM

## 2018-02-06 DIAGNOSIS — Z720 Tobacco use: Secondary | ICD-10-CM | POA: Insufficient documentation

## 2018-02-06 DIAGNOSIS — Z79899 Other long term (current) drug therapy: Secondary | ICD-10-CM | POA: Insufficient documentation

## 2018-02-06 DIAGNOSIS — R110 Nausea: Secondary | ICD-10-CM | POA: Insufficient documentation

## 2018-02-06 LAB — GLUCOSE, CAPILLARY: Glucose-Capillary: 121 mg/dL — ABNORMAL HIGH (ref 70–99)

## 2018-02-06 MED ORDER — ONDANSETRON 8 MG PO TBDP
8.0000 mg | ORAL_TABLET | Freq: Once | ORAL | Status: DC
  Filled 2018-02-06: qty 1

## 2018-02-06 NOTE — ED Notes (Signed)
See triage note  Presents with nausea today  States sxs' started while at work  But he did eat several brownies and some ice cream

## 2018-02-06 NOTE — ED Triage Notes (Addendum)
Pt c/o having nausea and just not feeling well today. Denies any pain vomiting or diarrhea. Pt works at [employer]. Pt states he ate 7 brownies and a bowl of ice cream while at work today.

## 2018-02-06 NOTE — ED Provider Notes (Signed)
[facility] Emergency Department Provider Note   ____________________________________________   First MD Initiated Contact with Patient 02/06/18 1415     (approximate)  I have reviewed the triage vital signs and the nursing notes.   HISTORY  Chief Complaint Nausea     HPI Dean Mueller is a 57 y.o. male patient complain of nausea and abdominal pain started this morning.  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  Patient approximately 2 hours later started feeling nausea.  Patient denies vomiting or diarrhea.  Patient denies abdominal pain but feels "bloated".  No palliative measures for complaint. History reviewed. No pertinent past medical history.  There are no active problems to display for this patient.   Past Surgical History:  Procedure Laterality Date  . KNEE SURGERY Right     Prior to Admission medications   Medication Sig Start Date End Date Taking? Authorizing Provider  cephALEXin (KEFLEX) 500 MG capsule Take 1 capsule (500 mg total) by mouth 3 (three) times daily. 11/12/17   Fisher, Roselyn Bering, PA-C  traMADol (ULTRAM) 50 MG tablet Take 1 tablet (50 mg total) by mouth every 6 (six) hours as needed. 11/12/17   Faythe Ghee, PA-C    Allergies Patient has no known allergies.  No family history on file.  Social History Social History   Tobacco Use  . Smoking status: Light Tobacco Smoker    Types: Cigarettes  . Smokeless tobacco: Never Used  Substance Use Topics  . Alcohol use: Yes  . Drug use: Not on file    Review of Systems Constitutional: No fever/chills Eyes: No visual changes. ENT: No sore throat. Cardiovascular: Denies chest pain. Respiratory: Denies shortness of breath. Gastrointestinal: No abdominal pain.  Nausea without vomiting.  No diarrhea.  No constipation. Genitourinary: Negative for dysuria. Musculoskeletal: Negative for back  pain. Skin: Negative for rash. Neurological: Negative for headaches, focal weakness or numbness.   ____________________________________________   PHYSICAL EXAM:  VITAL SIGNS: ED Triage Vitals  Enc Vitals Group     BP 02/06/18 1407 126/84     Pulse Rate 02/06/18 1407 98     Resp 02/06/18 1407 17     Temp 02/06/18 1407 98.4 F (36.9 C)     Temp Source 02/06/18 1407 Oral     SpO2 02/06/18 1407 99 %     Weight 02/06/18 1408 250 lb (113.4 kg)     Height 02/06/18 1408 6\' 2"  (1.88 m)     Head Circumference --      Peak Flow --      Pain Score 02/06/18 1408 0     Pain Loc --      Pain Edu? --      Excl. in GC? --    Constitutional: Alert and oriented. Well appearing and in no acute distress. Mouth/Throat: Mucous membranes are moist.  Oropharynx non-erythematous. Neck: No stridor. Hematological/Lymphatic/Immunilogical: No cervical lymphadenopathy. Cardiovascular: Normal rate, regular rhythm. Grossly normal heart sounds.  Good peripheral circulation. Respiratory: Normal respiratory effort.  No retractions. Lungs CTAB. Gastrointestinal: Soft and nontender. No distention. No abdominal bruits. No CVA tenderness. Musculoskeletal: No lower extremity tenderness nor edema.  No joint effusions. Neurologic:  Normal speech and language. No gross focal neurologic deficits are appreciated. No gait instability. Skin:  Skin is warm, dry and intact. No rash noted. Psychiatric: Mood and affect are normal. Speech and behavior are normal.  ____________________________________________  LABS (all labs ordered are listed, but only abnormal results are displayed)  Labs Reviewed  GLUCOSE, CAPILLARY - Abnormal; Notable for the following components:      Result Value   Glucose-Capillary 121 (*)    All other components within normal limits  CBG MONITORING, ED   ____________________________________________  EKG   ____________________________________________  RADIOLOGY  ED MD interpretation:     Official radiology report(s): No results found.  ____________________________________________   PROCEDURES  Procedure(s) performed: None  Procedures  Critical Care performed: No  ____________________________________________   INITIAL IMPRESSION / ASSESSMENT AND PLAN / ED COURSE  As part of my medical decision making, I reviewed the following data within the electronic MEDICAL RECORD NUMBER    Nausea and constipation secondary to overeating.  Patient given discharge care instructions.  Patient given 1 doses of Zofran prior to departure.  Patient advised return to ED if condition worsens.      ____________________________________________   FINAL CLINICAL IMPRESSION(S) / ED DIAGNOSES  Final diagnoses:  Nausea  Other constipation     ED Discharge Orders    None       Note:  This document was prepared using Dragon voice recognition software and may include unintentional dictation errors.    Joni Reining, PA-C 02/06/18 1441    Nita Sickle, MD 02/11/18 1343

## 2018-04-29 ENCOUNTER — Emergency Department
Admission: EM | Admit: 2018-04-29 | Discharge: 2018-04-29 | Disposition: A | Discharge status: Home / Self Care | Payer: Self-pay | Attending: Emergency Medicine | Admitting: Emergency Medicine

## 2018-04-29 ENCOUNTER — Encounter: Payer: Self-pay | Admitting: Emergency Medicine

## 2018-04-29 DIAGNOSIS — Z5321 Procedure and treatment not carried out due to patient leaving prior to being seen by health care provider: Secondary | ICD-10-CM | POA: Insufficient documentation

## 2018-04-29 DIAGNOSIS — R42 Dizziness and giddiness: Secondary | ICD-10-CM | POA: Insufficient documentation

## 2018-04-29 NOTE — ED Notes (Signed)
Patient refusing blood draw or EKG at this time. Patient requesting to leave. Patient states "I think I am just tired and need to go to bed". Dr. Mayford Knife notified and aware.

## 2018-04-29 NOTE — ED Triage Notes (Signed)
Patient presents to ED via POV from work due to dizziness and near syncope. Patient reports driving his son to [place] last night. Patient reports he got home at 0500 and had to be up to 0600 for work. Patient works at [employer] in [employer]. Patient states "I think I am just sleep deprived and the steam got to me". Denies dizziness at this time. Denis pain.

## 2020-04-22 ENCOUNTER — Other Ambulatory Visit: Payer: Self-pay

## 2022-06-01 DIAGNOSIS — Z419 Encounter for procedure for purposes other than remedying health state, unspecified: Secondary | ICD-10-CM | POA: Diagnosis not present

## 2022-07-02 DIAGNOSIS — Z419 Encounter for procedure for purposes other than remedying health state, unspecified: Secondary | ICD-10-CM | POA: Diagnosis not present

## 2022-07-13 ENCOUNTER — Emergency Department (HOSPITAL_COMMUNITY)
Admission: EM | Admit: 2022-07-13 | Discharge: 2022-07-13 | Disposition: A | Discharge status: Home / Self Care | Payer: Medicaid Other | Attending: Emergency Medicine | Admitting: Emergency Medicine

## 2022-07-13 ENCOUNTER — Emergency Department (HOSPITAL_COMMUNITY): Payer: Medicaid Other

## 2022-07-13 ENCOUNTER — Other Ambulatory Visit: Payer: Self-pay

## 2022-07-13 DIAGNOSIS — M25462 Effusion, left knee: Secondary | ICD-10-CM | POA: Diagnosis not present

## 2022-07-13 DIAGNOSIS — M25562 Pain in left knee: Secondary | ICD-10-CM | POA: Diagnosis not present

## 2022-07-13 DIAGNOSIS — X501XXA Overexertion from prolonged static or awkward postures, initial encounter: Secondary | ICD-10-CM | POA: Insufficient documentation

## 2022-07-13 MED ORDER — MELOXICAM 15 MG PO TABS: 15.0000 mg | ORAL_TABLET | Freq: Every day | ORAL | 0 refills | Status: AC

## 2022-07-13 MED ORDER — NAPROXEN 250 MG PO TABS
500.0000 mg | ORAL_TABLET | Freq: Once | ORAL | Status: AC
  Administered 2022-07-13: 500 mg via ORAL
  Filled 2022-07-13: qty 2

## 2022-07-13 NOTE — ED Notes (Signed)
Pt understands to call and get follow up appt asap  demo use of crutches well.

## 2022-07-13 NOTE — ED Provider Notes (Signed)
[place] EMERGENCY DEPARTMENT AT [facility] Provider Note   CSN: [id number] Arrival date & time: 07/13/22  1052     History  Chief Complaint  Patient presents with   Knee Pain    Dean Mueller is a 62 y.o. male.  Patient presents to the emergency department complaining of left-sided knee pain.  Patient states that he rolled over in bed last night, felt a sudden pop in the left knee, and instantly had pain and swelling.  Patient has known severe osteoarthritis in the left knee.  He previously had right-sided meniscal surgery 15+ years ago while living in [facility].  Patient states that he was hard to walk due to the pain in the knee.  No other relevant past medical history on file  HPI     Home Medications Prior to Admission medications   Medication Sig Start Date End Date Taking? Authorizing Provider  meloxicam (MOBIC) 15 MG tablet Take 1 tablet (15 mg total) by mouth daily. 07/13/22 08/12/22 Yes Darrick Grinder, PA-C  cephALEXin (KEFLEX) 500 MG capsule Take 1 capsule (500 mg total) by mouth 3 (three) times daily. 11/12/17   Fisher, Roselyn Bering, PA-C  traMADol (ULTRAM) 50 MG tablet Take 1 tablet (50 mg total) by mouth every 6 (six) hours as needed. 11/12/17   Faythe Ghee, PA-C      Allergies    Patient has no known allergies.    Review of Systems   Review of Systems  Physical Exam Updated Vital Signs BP (!) 160/90 (BP Location: Right Arm)   Pulse 73   Temp 97.8 F (36.6 C) (Oral)   Resp 18   Ht 6\' 2"  (1.88 m)   Wt 108.9 kg   SpO2 99%   BMI 30.81 kg/m  Physical Exam HENT:     Head: Normocephalic and atraumatic.  Eyes:     Pupils: Pupils are equal, round, and reactive to light.  Pulmonary:     Effort: Pulmonary effort is normal. No respiratory distress.  Musculoskeletal:        General: Swelling and tenderness present. No deformity.     Cervical back: Normal range of motion.     Comments: Left knee with swelling, positive McMurray  test  Skin:    General: Skin is dry.  Neurological:     Mental Status: He is alert.  Psychiatric:        Speech: Speech normal.        Behavior: Behavior normal.     ED Results / Procedures / Treatments   Labs (all labs ordered are listed, but only abnormal results are displayed) Labs Reviewed - No data to display  EKG None  Radiology DG Knee Complete 4 Views Left  Result Date: 07/13/2022 CLINICAL DATA:  Left knee pain and swelling. Patient was turning in bed last night and hurt left knee popping great pain. Now left knee swelling and pain. EXAM: LEFT KNEE - COMPLETE 4+ VIEW COMPARISON:  Left knee radiographs 05/18/2016 FINDINGS: Severe medial compartment joint space narrowing partial bone-on-bone contact, worsened from prior. Moderate weight-bearing medial femoral condyle subchondral cystic change. Moderate peripheral medial compartment and lateral compartment degenerative osteophytes. The lateral compartment joint space is maintained. Severe patellofemoral joint space narrowing and peripheral osteophytosis, mildly worsened from prior. No definite joint effusion. There is again an approximate 2.6 cm ossicle within the soft tissues posterior to the medial aspect of the medial femoral condyle. This suggests a loose body, possibly within a  Baker's cyst. No acute fracture or dislocation. IMPRESSION: 1. Severe medial and patellofemoral compartment osteoarthritis, worsened from prior. 2. There is again an approximate 2.6 cm ossicle within the soft tissues posterior to the medial femoral condyle. This suggests a loose body, possibly within a Baker's cyst. Electronically Signed   By: Neita Garnet M.D.   On: 07/13/2022 12:06    Procedures .Ortho Injury Treatment  Date/Time: 07/13/2022 1:35 PM  Performed by: Darrick Grinder, PA-C Authorized by: Darrick Grinder, PA-C   Consent:    Consent obtained:  Verbal   Consent given by:  Patient   Risks discussed:  Stiffness, restricted joint  movement and nerve damage   Alternatives discussed:  No treatmentInjury location: knee Location details: left knee Injury type: soft tissue Pre-procedure neurovascular assessment: neurovascularly intact Immobilization: brace Splint Applied by: ED Nurse Post-procedure neurovascular assessment: post-procedure neurovascularly intact       Medications Ordered in ED Medications  naproxen (NAPROSYN) tablet 500 mg (has no administration in time range)    ED Course/ Medical Decision Making/ A&P                             Medical Decision Making Amount and/or Complexity of Data Reviewed Radiology: ordered.  Risk Prescription drug management.   Patient presents to the emergency room with a chief complaint of left-sided knee pain.  Differential diagnosis includes but is not limited to fracture, dislocation, meniscal injury, ligamentous injury, and others  The patient does have history of osteoarthritis of the left knee  There is medication at this time for lab work  I ordered and interpreted imaging including plain films of the left knee which show severe osteoarthritis.  I agree with the radiologist findings.  I ordered the patient Naprosyn for inflammation.  Upon reassessment pain had improved slightly.  The patient was placed in a knee immobilizer and provided with crutches for ambulation.  Plan to discharge patient home with prescription for meloxicam and recommendations for follow-up with orthopedic surgery.  Based on exam question if patient may have an underlying meniscal injury.  Patient also has severe osteoarthritis and a Baker's cyst.  No suspicion of septic arthritis at this time.  Patient to discharge home        Final Clinical Impression(s) / ED Diagnoses Final diagnoses:  Acute pain of left knee    Rx / DC Orders ED Discharge Orders          Ordered    meloxicam (MOBIC) 15 MG tablet  Daily        07/13/22 1401              Pamala Duffel 07/13/22 1402    Jacalyn Lefevre, MD 07/14/22 [phone]

## 2022-07-13 NOTE — ED Triage Notes (Signed)
Pt was turning in bed last and heard left knee pop with great pain.  Now left knee swollen and painful

## 2022-07-13 NOTE — Discharge Instructions (Addendum)
You were evaluated today for left-sided knee pain.  There was no fracture or dislocation noted on imaging but your examination is concerning for possible soft tissue or meniscal injury.  Please follow-up with orthopedics for further evaluation and management.  I have prescribed anti-inflammatory medication to be taken as directed.  Do not take other NSAID medications while taking this medication.

## 2022-07-17 ENCOUNTER — Ambulatory Visit: Payer: Medicaid Other | Admitting: Orthopaedic Surgery

## 2022-07-17 ENCOUNTER — Encounter: Payer: Self-pay | Admitting: Orthopaedic Surgery

## 2022-07-17 VITALS — BP 130/85 | HR 85 | Ht 74.0 in | Wt 244.0 lb

## 2022-07-17 DIAGNOSIS — M25462 Effusion, left knee: Secondary | ICD-10-CM | POA: Diagnosis not present

## 2022-07-17 DIAGNOSIS — M25562 Pain in left knee: Secondary | ICD-10-CM | POA: Diagnosis not present

## 2022-07-17 DIAGNOSIS — G8929 Other chronic pain: Secondary | ICD-10-CM | POA: Diagnosis not present

## 2022-07-17 MED ORDER — METHYLPREDNISOLONE ACETATE 40 MG/ML IJ SUSP
40.0000 mg | Freq: Once | INTRAMUSCULAR | Status: AC
  Administered 2022-07-17: 40 mg via INTRA_ARTICULAR

## 2022-07-17 NOTE — Patient Instructions (Signed)
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 HE LEAVES THURSDAY BY 11:30AM. HE DOES NOT RETURN TO THE OFFICE UNTIL TUESDAY MORNINGS AND HE DOES NOT CHECK HIS WORK MESSAGES DURING HIS TIME AWAY FROM THE OFFICE.

## 2022-07-17 NOTE — Progress Notes (Signed)
Subjective:    Patient ID: Dean Mueller, male    DOB: Aug 24, 1960, 62 y.o.   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  He has had swelling and pain of the left knee since 07-13-22.  He went to the ER and had X-rays.  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  He has used crutches and stayed off the knee. Ice and rest have not helped.  He has pain on weight bearing.  He denies any trauma, redness.  He has giving way.  I have independently reviewed and interpreted x-rays of this patient done at another site by another physician or qualified health professional.   Review of Systems  Constitutional:  Positive for activity change.  Musculoskeletal:  Positive for arthralgias, gait problem and joint swelling.  All other systems reviewed and are negative. For Review of Systems, all other systems reviewed and are negative.  The following is a summary of the past history medically, past history surgically, known current medicines, social history and family history.  This information is gathered electronically by the computer from prior information and documentation.  I review this each visit and have found including this information at this point in the chart is beneficial and informative.   No past medical history on file.  Past Surgical History:  Procedure Laterality Date   KNEE SURGERY Right     Current Outpatient Medications on File Prior to Visit  Medication Sig Dispense Refill   meloxicam (MOBIC) 15 MG tablet Take 1 tablet (15 mg total) by mouth daily. 30 tablet 0   traMADol (ULTRAM) 50 MG tablet Take 1 tablet (50 mg total) by mouth every 6 (six) hours as needed. 15 tablet 0   No current facility-administered medications on file prior to visit.    Social History   Socioeconomic History   Marital status: Single    Spouse name: Not on file   Number of children: Not on file   Years of education: Not on file   Highest education level: Not on file   Occupational History   Not on file  Tobacco Use   Smoking status: Light Smoker    Types: Cigarettes   Smokeless tobacco: Never  Substance and Sexual Activity   Alcohol use: Yes   Drug use: Never   Sexual activity: Not on file  Other Topics Concern   Not on file  Social History Narrative   Not on file   Social Determinants of Health   Financial Resource Strain: Not on file  Food Insecurity: Not on file  Transportation Needs: Not on file  Physical Activity: Not on file  Stress: Not on file  Social Connections: Not on file  Intimate Partner Violence: Not on file    Family History  Problem Relation Age of Onset   Kidney failure Mother    Kidney failure Brother    Diabetes Brother     BP 130/85   Pulse 85   Ht  (1.88 m)   Wt 244 lb (110.7 kg)   BMI 31.33 kg/m   Body mass index is 31.33 kg/m.      Objective:   Physical Exam Vitals and nursing note reviewed. Exam conducted with a chaperone present.  Constitutional:      Appearance: He is well-developed.  HENT:     Head: Normocephalic and atraumatic.  Eyes:     Conjunctiva/sclera: Conjunctivae normal.     Pupils: Pupils are  equal, round, and reactive to light.  Cardiovascular:     Rate and Rhythm: Normal rate and regular rhythm.  Pulmonary:     Effort: Pulmonary effort is normal.  Abdominal:     Palpations: Abdomen is soft.  Musculoskeletal:     Cervical back: Normal range of motion and neck supple.       Legs:  Skin:    General: Skin is warm and dry.  Neurological:     Mental Status: He is alert and oriented to person, place, and time.     Cranial Nerves: No cranial nerve deficit.     Motor: No abnormal muscle tone.     Coordination: Coordination normal.     Deep Tendon Reflexes: Reflexes are normal and symmetric. Reflexes normal.  Psychiatric:        Behavior: Behavior normal.        Thought Content: Thought content normal.        Judgment: Judgment normal.      I have reviewed the  outside notes.     Assessment & Plan:   Encounter Diagnosis  Name Primary?   Chronic pain of left knee Yes   PROCEDURE NOTE:  The patient request injection, verbal consent was obtained.  The left knee was prepped appropriately after time out was performed.   Sterile technique was observed and anesthesia was provided by ethyl chloride and a 20-gauge needle was used to inject the knee area.  A 16-gauge needle was then used to aspirate the knee.  Color of fluid aspirated was blood tinged  Total cc's aspirated was 28.    Injection of 1 cc of DepoMedrol 40 mg with several cc's of plain xylocaine was then performed.  A band aid dressing was applied.  The patient was advised to apply ice later today and tomorrow to the injection sight as needed.   Stay out of work.  Return in one week.  He may need MRI.  Call if any problem.  Precautions discussed.  Electronically Signed Darreld Mclean, MD 4/16/20241:47 PM

## 2022-07-24 ENCOUNTER — Encounter: Payer: Self-pay | Admitting: Orthopaedic Surgery

## 2022-07-24 ENCOUNTER — Ambulatory Visit (INDEPENDENT_AMBULATORY_CARE_PROVIDER_SITE_OTHER): Payer: Medicaid Other | Admitting: Orthopaedic Surgery

## 2022-07-24 VITALS — BP 164/103 | HR 104 | Ht 74.0 in | Wt 244.0 lb

## 2022-07-24 DIAGNOSIS — M25562 Pain in left knee: Secondary | ICD-10-CM | POA: Diagnosis not present

## 2022-07-24 DIAGNOSIS — G8929 Other chronic pain: Secondary | ICD-10-CM | POA: Diagnosis not present

## 2022-07-24 NOTE — Progress Notes (Signed)
My knee is a little better.  He has no further swelling of the left knee but has giving way at times.  He has been active and is not working yet.  The right knee has slight effusion, crepitus, medial joint line pain, positive medial McMurray but no limp today.  NV intact.  I feel he needs MRI but he will have to wait another month due to insurance reasons.  Encounter Diagnosis  Name Primary?   Chronic pain of left knee Yes   Return in one month.  Consider MRI.  Call if any problem.  Precautions discussed.  Electronically Signed Darreld Mclean, MD 4/23/20242:25 PM

## 2022-07-24 NOTE — Patient Instructions (Addendum)
Knee Exercises Ask your health care provider which exercises are safe for you. Do exercises exactly as told by your health care provider and adjust them as directed. It is normal to feel mild stretching, pulling, tightness, or discomfort as you do these exercises. Stop right away if you feel sudden pain or your pain gets worse. Do not begin these exercises until told by your health care provider. Stretching and range-of-motion exercises These exercises warm up your muscles and joints and improve the movement and flexibility of your knee. These exercises also help to relieve pain and swelling. Knee extension, prone  Lie on your abdomen (prone position) on a bed. Place your left / right knee just beyond the edge of the surface so your knee is not on the bed. You can put a towel under your left / right thigh just above your kneecap for comfort. Relax your leg muscles and allow gravity to straighten your knee (extension). You should feel a stretch behind your left / right knee. Hold this position for __________ seconds. Scoot up so your knee is supported between repetitions. Repeat __________ times. Complete this exercise __________ times a day. Knee flexion, active  Lie on your back with both legs straight. If this causes back discomfort, bend your left / right knee so your foot is flat on the floor. Slowly slide your left / right heel back toward your buttocks. Stop when you feel a gentle stretch in the front of your knee or thigh (flexion). Hold this position for __________ seconds. Slowly slide your left / right heel back to the starting position. Repeat __________ times. Complete this exercise __________ times a day. Quadriceps stretch, prone  Lie on your abdomen on a firm surface, such as a bed or padded floor. Bend your left / right knee and hold your ankle. If you cannot reach your ankle or pant leg, loop a belt around your foot and grab the belt instead. Gently pull your heel toward your  buttocks. Your knee should not slide out to the side. You should feel a stretch in the front of your thigh and knee (quadriceps). Hold this position for __________ seconds. Repeat __________ times. Complete this exercise __________ times a day. Hamstring, supine  Lie on your back (supine position). Loop a belt or towel over the ball of your left / right foot. The ball of your foot is on the walking surface, right under your toes. Straighten your left / right knee and slowly pull on the belt to raise your leg until you feel a gentle stretch behind your knee (hamstring). Do not let your knee bend while you do this. Keep your other leg flat on the floor. Hold this position for __________ seconds. Repeat __________ times. Complete this exercise __________ times a day. Strengthening exercises These exercises build strength and endurance in your knee. Endurance is the ability to use your muscles for a long time, even after they get tired. Quadriceps, isometric This exercise strengthens the muscles in front of your thigh (quadriceps) without moving your knee joint (isometric). Lie on your back with your left / right leg extended and your other knee bent. Put a rolled towel or small pillow under your knee if told by your health care provider. Slowly tense the muscles in the front of your left / right thigh. You should see your kneecap slide up toward your hip or see increased dimpling just above the knee. This motion will push the back of the knee toward the floor.   For __________ seconds, hold the muscle as tight as you can without increasing your pain. Relax the muscles slowly and completely. Repeat __________ times. Complete this exercise __________ times a day. Straight leg raises This exercise strengthens the muscles in front of your thigh (quadriceps) and the muscles that move your hips (hip flexors). Lie on your back with your left / right leg extended and your other knee bent. Tense the  muscles in the front of your left / right thigh. You should see your kneecap slide up or see increased dimpling just above the knee. Your thigh may even shake a bit. Keep these muscles tight as you raise your leg 4-6 inches (10-15 cm) off the floor. Do not let your knee bend. Hold this position for __________ seconds. Keep these muscles tense as you lower your leg. Relax your muscles slowly and completely after each repetition. Repeat __________ times. Complete this exercise __________ times a day. Hamstring, isometric  Lie on your back on a firm surface. Bend your left / right knee about __________ degrees. Dig your left / right heel into the surface as if you are trying to pull it toward your buttocks. Tighten the muscles in the back of your thighs (hamstring) to "dig" as hard as you can without increasing any pain. Hold this position for __________ seconds. Release the tension gradually and allow your muscles to relax completely for __________ seconds after each repetition. Repeat __________ times. Complete this exercise __________ times a day. Hamstring curls If told by your health care provider, do this exercise while wearing ankle weights. Begin with __________lb / kg weights. Then increase the weight by 1 lb (0.5 kg) increments. Do not wear ankle weights that are more than __________lb / kg. Lie on your abdomen with your legs straight. Bend your left / right knee as far as you can without feeling pain. Keep your hips flat against the floor. Hold this position for __________ seconds. Slowly lower your leg to the starting position. Repeat __________ times. Complete this exercise __________ times a day. Squats This exercise strengthens the muscles in front of your thigh and knee (quadriceps). Stand in front of a table, with your feet and knees pointing straight ahead. You may rest your hands on the table for balance but not for support. Slowly bend your knees and lower your hips like you  are going to sit in a chair. Keep your weight over your heels, not over your toes. Keep your lower legs upright so they are parallel with the table legs. Do not let your hips go lower than your knees. Do not bend lower than told by your health care provider. If your knee pain increases, do not bend as low. Hold the squat position for __________ seconds. Slowly push with your legs to return to standing. Do not use your hands to pull yourself to standing. Repeat __________ times. Complete this exercise __________ times a day. Wall slides This exercise strengthens the muscles in front of your thigh and knee (quadriceps). Lean your back against a smooth wall or door, and walk your feet out 18-24 inches (46-61 cm) from it. Place your feet hip-width apart. Slowly slide down the wall or door until your knees bend __________ degrees. Keep your knees over your heels, not over your toes. Keep your knees in line with your hips. Hold this position for __________ seconds. Repeat __________ times. Complete this exercise __________ times a day. Straight leg raises, side-lying This exercise strengthens the muscles that rotate   the leg at the hip and move it away from your body (hip abductors). Lie on your side with your left / right leg in the top position. Lie so your head, shoulder, knee, and hip line up. You may bend your bottom knee to help you keep your balance. Roll your hips slightly forward so your hips are stacked directly over each other and your left / right knee is facing forward. Leading with your heel, lift your top leg 4-6 inches (10-15 cm). You should feel the muscles in your outer hip lifting. Do not let your foot drift forward. Do not let your knee roll toward the ceiling. Hold this position for __________ seconds. Slowly return your leg to the starting position. Let your muscles relax completely after each repetition. Repeat __________ times. Complete this exercise __________ times a  day. Straight leg raises, prone This exercise stretches the muscles that move your hips away from the front of the pelvis (hip extensors). Lie on your abdomen on a firm surface. You can put a pillow under your hips if that is more comfortable. Tense the muscles in your buttocks and lift your left / right leg about 4-6 inches (10-15 cm). Keep your knee straight as you lift your leg. Hold this position for __________ seconds. Slowly lower your leg to the starting position. Let your leg relax completely after each repetition. Repeat __________ times. Complete this exercise __________ times a day. This information is not intended to replace advice given to you by your health care provider. Make sure you discuss any questions you have with your health care provider. Document Revised: 11/29/2020 Document Reviewed: 11/29/2020 Elsevier Patient Education  2023 [employer]  

## 2022-08-21 ENCOUNTER — Encounter: Payer: Self-pay | Admitting: Orthopaedic Surgery

## 2022-08-21 ENCOUNTER — Ambulatory Visit (INDEPENDENT_AMBULATORY_CARE_PROVIDER_SITE_OTHER): Payer: Self-pay | Admitting: Orthopaedic Surgery

## 2022-08-21 VITALS — BP 144/93 | HR 80 | Ht 74.0 in | Wt 243.0 lb

## 2022-08-21 DIAGNOSIS — M25562 Pain in left knee: Secondary | ICD-10-CM

## 2022-08-21 DIAGNOSIS — G8929 Other chronic pain: Secondary | ICD-10-CM

## 2022-08-21 NOTE — Progress Notes (Signed)
My knee is still sore at times.  He has less swelling to the left knee but has pain at times.  He has started using a stationary bicycle and has improved.  He has popping still and morning pain.  He has some giving way at times.  He has no new trauma, no redness.  Left knee has slight effusion, crepitus and ROM 0 to 110, positive medial McMurray, slight limp left, NV intact, no distal edema.  Encounter Diagnosis  Name Primary?   Chronic pain of left knee Yes   I will get MRI of the left knee.  Return in two weeks.  Call if any problem.  Precautions discussed.  Electronically Signed Darreld Mclean, MD 5/21/20242:27 PM

## 2022-08-21 NOTE — Patient Instructions (Signed)
Central scheduling [phone] 

## 2022-08-28 ENCOUNTER — Telehealth: Payer: Self-pay | Admitting: Radiology

## 2022-08-28 NOTE — Telephone Encounter (Signed)
Patient called, LM asking for a note to RTW.  He needs to go back to work.  Has not had any procedure on knee, just needs to RTW.

## 2022-08-28 NOTE — Telephone Encounter (Signed)
Will it be okay to give him a letter to return to work?

## 2022-08-29 ENCOUNTER — Telehealth: Payer: Self-pay

## 2022-08-30 NOTE — Telephone Encounter (Signed)
Letter at front desk  patient was notified as well

## 2022-08-31 ENCOUNTER — Ambulatory Visit: Payer: Medicaid Other | Admitting: Internal Medicine

## 2022-09-01 DIAGNOSIS — Z419 Encounter for procedure for purposes other than remedying health state, unspecified: Secondary | ICD-10-CM | POA: Diagnosis not present

## 2022-09-04 NOTE — Telephone Encounter (Signed)
Done

## 2022-09-05 ENCOUNTER — Encounter: Payer: Self-pay | Admitting: Orthopaedic Surgery

## 2022-09-21 ENCOUNTER — Encounter: Payer: Self-pay | Admitting: Internal Medicine

## 2022-09-21 ENCOUNTER — Ambulatory Visit (INDEPENDENT_AMBULATORY_CARE_PROVIDER_SITE_OTHER): Payer: Medicaid Other | Admitting: Internal Medicine

## 2022-09-21 VITALS — BP 122/82 | HR 83 | Ht 74.0 in | Wt 245.0 lb

## 2022-09-21 DIAGNOSIS — Z1329 Encounter for screening for other suspected endocrine disorder: Secondary | ICD-10-CM | POA: Diagnosis not present

## 2022-09-21 DIAGNOSIS — M25562 Pain in left knee: Secondary | ICD-10-CM | POA: Diagnosis not present

## 2022-09-21 DIAGNOSIS — Z13220 Encounter for screening for lipoid disorders: Secondary | ICD-10-CM

## 2022-09-21 DIAGNOSIS — Z1321 Encounter for screening for nutritional disorder: Secondary | ICD-10-CM

## 2022-09-21 DIAGNOSIS — Z720 Tobacco use: Secondary | ICD-10-CM | POA: Diagnosis not present

## 2022-09-21 DIAGNOSIS — Z139 Encounter for screening, unspecified: Secondary | ICD-10-CM | POA: Diagnosis not present

## 2022-09-21 DIAGNOSIS — Z1211 Encounter for screening for malignant neoplasm of colon: Secondary | ICD-10-CM

## 2022-09-21 DIAGNOSIS — Z114 Encounter for screening for human immunodeficiency virus [HIV]: Secondary | ICD-10-CM

## 2022-09-21 DIAGNOSIS — Z131 Encounter for screening for diabetes mellitus: Secondary | ICD-10-CM

## 2022-09-21 DIAGNOSIS — Z1159 Encounter for screening for other viral diseases: Secondary | ICD-10-CM | POA: Diagnosis not present

## 2022-09-21 NOTE — Assessment & Plan Note (Signed)
Currently smokes half pack of cigarettes weekly and has been smoking since age 62. -The patient was counseled on the dangers of tobacco use, and was advised to quit.  Reviewed strategies to maximize success, including removing cigarettes and smoking materials from environment, stress management, substitution of other forms of reinforcement, support of family/friends, and written materials.

## 2022-09-21 NOTE — Assessment & Plan Note (Signed)
Cologuard ordered today °

## 2022-09-21 NOTE — Patient Instructions (Signed)
It was a pleasure to see you today.  Thank you for giving [place] the opportunity to be involved in your care.  Below is a brief recap of your visit and next steps.  We will plan to see you again in 6 months.  Summary You have established care today We will check basic labs Cologuard has been ordered for colon cancer screening I recommend that you quit smoking Follow up in 6 months

## 2022-09-21 NOTE — Assessment & Plan Note (Signed)
He endorses a 60-month history of left knee pain.  Followed by orthopedic surgery (Dr. Hilda Lias).  MRI is pending.

## 2022-09-21 NOTE — Progress Notes (Signed)
New Patient Office Visit  Subjective    Patient ID: Dean Mueller, male    DOB: 04-21-1960  Age: 62 y.o. MRN: [id number]  CC:  Chief Complaint  Patient presents with   Establish Care   HPI Dean Mueller presents to establish care.  He is a 62 year old male with a history of chronic left knee pain but otherwise no significant past medical history.  He has not had a primary care provider in many years.  Dean Mueller reports feeling well today.  He endorses chronic left knee pain but is otherwise asymptomatic and has no acute concerns to discuss aside from desiring to establish care.  He currently works at [employer] and also owns a [employer].  He smokes half pack of cigarettes weekly and has been smoking since age 46.  He drinks 5 beers weekly and endorses occasional marijuana use.  His family medical history is significant for diabetes mellitus, HTN, ESRD, and CAD.  Chronic medical conditions and outstanding preventative care and discussed today are individually addressed A/P below.  Outpatient Encounter Medications as of 09/21/2022  Medication Sig   [DISCONTINUED] traMADol (ULTRAM) 50 MG tablet Take 1 tablet (50 mg total) by mouth every 6 (six) hours as needed.   No facility-administered encounter medications on file as of 09/21/2022.    History reviewed. No pertinent past medical history.  Past Surgical History:  Procedure Laterality Date   KNEE SURGERY Right     Family History  Problem Relation Age of Onset   Kidney failure Mother    Kidney failure Brother    Diabetes Brother     Social History   Socioeconomic History   Marital status: Single    Spouse name: Not on file   Number of children: Not on file   Years of education: Not on file   Highest education level: Not on file  Occupational History   Not on file  Tobacco Use   Smoking status: Light Smoker    Types: Cigarettes   Smokeless tobacco: Never  Substance and Sexual Activity   Alcohol use: Yes    Drug use: Never   Sexual activity: Not on file  Other Topics Concern   Not on file  Social History Narrative   Not on file   Social Determinants of Health   Financial Resource Strain: Not on file  Food Insecurity: Not on file  Transportation Needs: Not on file  Physical Activity: Not on file  Stress: Not on file  Social Connections: Not on file  Intimate Partner Violence: Not on file   Review of Systems  Constitutional:  Negative for chills and fever.  HENT:  Negative for sore throat.   Respiratory:  Negative for cough and shortness of breath.   Cardiovascular:  Negative for chest pain, palpitations and leg swelling.  Gastrointestinal:  Negative for abdominal pain, blood in stool, constipation, diarrhea, nausea and vomiting.  Genitourinary:  Negative for dysuria and hematuria.  Musculoskeletal:  Positive for joint pain (Chronic left knee pain). Negative for myalgias.  Skin:  Negative for itching and rash.  Neurological:  Negative for dizziness and headaches.  Psychiatric/Behavioral:  Negative for depression and suicidal ideas.   All other systems reviewed and are negative.  Objective    BP 122/82   Pulse 83   Ht 6\' 2"  (1.88 m)   Wt 245 lb (111.1 kg)   SpO2 98%   BMI 31.46 kg/m   Physical Exam Vitals reviewed.  Constitutional:  General: He is not in acute distress.    Appearance: Normal appearance. He is not ill-appearing.  HENT:     Head: Normocephalic and atraumatic.     Right Ear: External ear normal.     Left Ear: External ear normal.     Nose: Nose normal. No congestion or rhinorrhea.     Mouth/Throat:     Mouth: Mucous membranes are moist.     Pharynx: Oropharynx is clear.  Eyes:     General: No scleral icterus.    Extraocular Movements: Extraocular movements intact.     Conjunctiva/sclera: Conjunctivae normal.     Pupils: Pupils are equal, round, and reactive to light.  Cardiovascular:     Rate and Rhythm: Normal rate and regular rhythm.      Pulses: Normal pulses.     Heart sounds: Normal heart sounds. No murmur heard. Pulmonary:     Effort: Pulmonary effort is normal.     Breath sounds: Normal breath sounds. No wheezing, rhonchi or rales.  Abdominal:     General: Abdomen is flat. Bowel sounds are normal. There is no distension.     Palpations: Abdomen is soft.     Tenderness: There is no abdominal tenderness.  Musculoskeletal:        General: No swelling or deformity. Normal range of motion.     Cervical back: Normal range of motion.  Skin:    General: Skin is warm and dry.     Capillary Refill: Capillary refill takes less than 2 seconds.  Neurological:     General: No focal deficit present.     Mental Status: He is alert and oriented to person, place, and time.     Motor: No weakness.  Psychiatric:        Mood and Affect: Mood normal.        Behavior: Behavior normal.        Thought Content: Thought content normal.    Assessment & Plan:   Problem List Items Addressed This Visit       Left knee pain    He endorses a 47-month history of left knee pain.  Followed by orthopedic surgery (Dr. Hilda Lias).  MRI is pending.      Tobacco use    Currently smokes half pack of cigarettes weekly and has been smoking since age 68. -The patient was counseled on the dangers of tobacco use, and was advised to quit.  Reviewed strategies to maximize success, including removing cigarettes and smoking materials from environment, stress management, substitution of other forms of reinforcement, support of family/friends, and written materials.       Colon cancer screening    Cologuard ordered today      Return in about 6 months (around 03/23/2023).   Billie Lade, MD

## 2022-09-22 LAB — LIPID PANEL
Chol/HDL Ratio: 4.5 ratio (ref 0.0–5.0)
Cholesterol, Total: 187 mg/dL (ref 100–199)
HDL: 42 mg/dL (ref >39)
LDL Chol Calc (NIH): 116 mg/dL — ABNORMAL HIGH (ref 0–99)
Triglycerides: 163 mg/dL — ABNORMAL HIGH (ref 0–149)
VLDL Cholesterol Cal: 29 mg/dL (ref 5–40)

## 2022-09-22 LAB — CBC WITH DIFFERENTIAL/PLATELET
Basophils Absolute: 0 10*3/uL (ref 0.0–0.2)
Basos: 1 %
EOS (ABSOLUTE): 0.1 10*3/uL (ref 0.0–0.4)
Eos: 2 %
Hematocrit: 49.2 % (ref 37.5–51.0)
Hemoglobin: 16.3 g/dL (ref 13.0–17.7)
Immature Grans (Abs): 0 10*3/uL (ref 0.0–0.1)
Immature Granulocytes: 0 %
Lymphocytes Absolute: 1.7 10*3/uL (ref 0.7–3.1)
Lymphs: 33 %
MCH: 29.7 pg (ref 26.6–33.0)
MCHC: 33.1 g/dL (ref 31.5–35.7)
MCV: 90 fL (ref 79–97)
Monocytes Absolute: 0.4 10*3/uL (ref 0.1–0.9)
Monocytes: 7 %
Neutrophils Absolute: 2.9 10*3/uL (ref 1.4–7.0)
Neutrophils: 57 %
Platelets: 144 10*3/uL — ABNORMAL LOW (ref 150–450)
RBC: 5.49 x10E6/uL (ref 4.14–5.80)
RDW: 12.6 % (ref 11.6–15.4)
WBC: 5.1 10*3/uL (ref 3.4–10.8)

## 2022-09-22 LAB — HEMOGLOBIN A1C
Est. average glucose Bld gHb Est-mCnc: 131 mg/dL
Hgb A1c MFr Bld: 6.2 % — ABNORMAL HIGH (ref 4.8–5.6)

## 2022-09-22 LAB — CMP14+EGFR
ALT: 8 IU/L (ref 0–44)
AST: 18 IU/L (ref 0–40)
Albumin: 4.2 g/dL (ref 3.9–4.9)
Alkaline Phosphatase: 72 IU/L (ref 44–121)
BUN/Creatinine Ratio: 12 (ref 10–24)
BUN: 9 mg/dL (ref 8–27)
Bilirubin Total: 0.4 mg/dL (ref 0.0–1.2)
CO2: 24 mmol/L (ref 20–29)
Calcium: 9.6 mg/dL (ref 8.6–10.2)
Chloride: 108 mmol/L — ABNORMAL HIGH (ref 96–106)
Creatinine, Ser: 0.77 mg/dL (ref 0.76–1.27)
Globulin, Total: 2.5 g/dL (ref 1.5–4.5)
Glucose: 98 mg/dL (ref 70–99)
Potassium: 4.1 mmol/L (ref 3.5–5.2)
Sodium: 143 mmol/L (ref 134–144)
Total Protein: 6.7 g/dL (ref 6.0–8.5)
eGFR: 102 mL/min/{1.73_m2} (ref >59)

## 2022-09-22 LAB — HCV AB W REFLEX TO QUANT PCR: HCV Ab: NONREACTIVE

## 2022-09-22 LAB — B12 AND FOLATE PANEL
Folate: 13.5 ng/mL (ref >3.0)
Vitamin B-12: 335 pg/mL (ref 232–1245)

## 2022-09-22 LAB — HCV INTERPRETATION

## 2022-09-22 LAB — HIV ANTIBODY (ROUTINE TESTING W REFLEX): HIV Screen 4th Generation wRfx: NONREACTIVE

## 2022-09-22 LAB — VITAMIN D 25 HYDROXY (VIT D DEFICIENCY, FRACTURES): Vit D, 25-Hydroxy: 6.1 ng/mL — ABNORMAL LOW (ref 30.0–100.0)

## 2022-09-22 LAB — TSH+FREE T4
Free T4: 1.19 ng/dL (ref 0.82–1.77)
TSH: 1.38 u[IU]/mL (ref 0.450–4.500)

## 2022-09-23 ENCOUNTER — Other Ambulatory Visit: Payer: Self-pay | Admitting: Internal Medicine

## 2022-09-23 DIAGNOSIS — E559 Vitamin D deficiency, unspecified: Secondary | ICD-10-CM

## 2022-09-23 MED ORDER — VITAMIN D (ERGOCALCIFEROL) 1.25 MG (50000 UNIT) PO CAPS
50000.0000 [IU] | ORAL_CAPSULE | ORAL | 0 refills | Status: AC
Start: 2022-09-23 — End: 2022-12-10

## 2022-09-24 ENCOUNTER — Other Ambulatory Visit: Payer: Self-pay

## 2022-09-24 DIAGNOSIS — E559 Vitamin D deficiency, unspecified: Secondary | ICD-10-CM

## 2022-10-01 DIAGNOSIS — Z419 Encounter for procedure for purposes other than remedying health state, unspecified: Secondary | ICD-10-CM | POA: Diagnosis not present

## 2022-11-01 DIAGNOSIS — Z419 Encounter for procedure for purposes other than remedying health state, unspecified: Secondary | ICD-10-CM | POA: Diagnosis not present

## 2022-12-02 DIAGNOSIS — Z419 Encounter for procedure for purposes other than remedying health state, unspecified: Secondary | ICD-10-CM | POA: Diagnosis not present

## 2023-01-01 DIAGNOSIS — Z419 Encounter for procedure for purposes other than remedying health state, unspecified: Secondary | ICD-10-CM | POA: Diagnosis not present

## 2023-02-01 DIAGNOSIS — Z419 Encounter for procedure for purposes other than remedying health state, unspecified: Secondary | ICD-10-CM | POA: Diagnosis not present

## 2023-03-03 DIAGNOSIS — Z419 Encounter for procedure for purposes other than remedying health state, unspecified: Secondary | ICD-10-CM | POA: Diagnosis not present

## 2023-03-22 ENCOUNTER — Ambulatory Visit: Payer: Medicaid Other | Admitting: Internal Medicine

## 2023-04-02 ENCOUNTER — Encounter: Payer: Self-pay | Admitting: Internal Medicine

## 2023-04-03 DIAGNOSIS — Z419 Encounter for procedure for purposes other than remedying health state, unspecified: Secondary | ICD-10-CM | POA: Diagnosis not present

## 2023-05-04 DIAGNOSIS — Z419 Encounter for procedure for purposes other than remedying health state, unspecified: Secondary | ICD-10-CM | POA: Diagnosis not present

## 2023-06-01 DIAGNOSIS — Z419 Encounter for procedure for purposes other than remedying health state, unspecified: Secondary | ICD-10-CM | POA: Diagnosis not present

## 2023-07-13 DIAGNOSIS — Z419 Encounter for procedure for purposes other than remedying health state, unspecified: Secondary | ICD-10-CM | POA: Diagnosis not present

## 2023-08-12 DIAGNOSIS — Z419 Encounter for procedure for purposes other than remedying health state, unspecified: Secondary | ICD-10-CM | POA: Diagnosis not present

## 2023-09-12 DIAGNOSIS — Z419 Encounter for procedure for purposes other than remedying health state, unspecified: Secondary | ICD-10-CM | POA: Diagnosis not present

## 2023-10-12 DIAGNOSIS — Z419 Encounter for procedure for purposes other than remedying health state, unspecified: Secondary | ICD-10-CM | POA: Diagnosis not present

## 2023-11-12 DIAGNOSIS — Z419 Encounter for procedure for purposes other than remedying health state, unspecified: Secondary | ICD-10-CM | POA: Diagnosis not present

## 2023-11-25 DIAGNOSIS — N50812 Left testicular pain: Secondary | ICD-10-CM | POA: Diagnosis not present

## 2023-11-25 DIAGNOSIS — Z131 Encounter for screening for diabetes mellitus: Secondary | ICD-10-CM | POA: Diagnosis not present

## 2023-11-25 DIAGNOSIS — Z1331 Encounter for screening for depression: Secondary | ICD-10-CM | POA: Diagnosis not present

## 2023-11-25 DIAGNOSIS — Z0000 Encounter for general adult medical examination without abnormal findings: Secondary | ICD-10-CM | POA: Diagnosis not present

## 2023-11-25 DIAGNOSIS — R6882 Decreased libido: Secondary | ICD-10-CM | POA: Diagnosis not present

## 2023-11-25 DIAGNOSIS — Z1389 Encounter for screening for other disorder: Secondary | ICD-10-CM | POA: Diagnosis not present

## 2023-12-13 DIAGNOSIS — Z419 Encounter for procedure for purposes other than remedying health state, unspecified: Secondary | ICD-10-CM | POA: Diagnosis not present

## 2024-03-13 DIAGNOSIS — Z419 Encounter for procedure for purposes other than remedying health state, unspecified: Secondary | ICD-10-CM | POA: Diagnosis not present
